# Patient Record
Sex: Female | Born: 1990 | Race: Black or African American | Hispanic: No | Marital: Single | Smoking: Never smoker
Health system: Southern US, Community
[De-identification: ages and names within clinical notes are randomized; demographics above are authoritative.]

## PROBLEM LIST (undated history)

## (undated) DIAGNOSIS — O24419 Gestational diabetes mellitus in pregnancy, unspecified control: Secondary | ICD-10-CM

## (undated) HISTORY — PX: NO PAST SURGERIES: SHX2092

## (undated) HISTORY — DX: Gestational diabetes mellitus in pregnancy, unspecified control: O24.419

---
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
## 2015-07-08 ENCOUNTER — Emergency Department (HOSPITAL_COMMUNITY)
Admission: EM | Admit: 2015-07-08 | Discharge: 2015-07-08 | Disposition: A | Discharge status: Home / Self Care | Payer: No Typology Code available for payment source | Attending: Emergency Medicine | Admitting: Emergency Medicine

## 2015-07-08 ENCOUNTER — Emergency Department (HOSPITAL_COMMUNITY): Payer: No Typology Code available for payment source

## 2015-07-08 ENCOUNTER — Encounter (HOSPITAL_COMMUNITY): Payer: Self-pay

## 2015-07-08 DIAGNOSIS — Z3202 Encounter for pregnancy test, result negative: Secondary | ICD-10-CM | POA: Diagnosis not present

## 2015-07-08 DIAGNOSIS — Y998 Other external cause status: Secondary | ICD-10-CM | POA: Diagnosis not present

## 2015-07-08 DIAGNOSIS — M62830 Muscle spasm of back: Secondary | ICD-10-CM | POA: Diagnosis not present

## 2015-07-08 DIAGNOSIS — Y92410 Unspecified street and highway as the place of occurrence of the external cause: Secondary | ICD-10-CM | POA: Diagnosis not present

## 2015-07-08 DIAGNOSIS — Y9389 Activity, other specified: Secondary | ICD-10-CM | POA: Insufficient documentation

## 2015-07-08 DIAGNOSIS — S3992XA Unspecified injury of lower back, initial encounter: Secondary | ICD-10-CM | POA: Diagnosis present

## 2015-07-08 LAB — I-STAT BETA HCG BLOOD, ED (MC, WL, AP ONLY): I-stat hCG, quantitative: <5 m[IU]/mL (ref <5)

## 2015-07-08 MED ORDER — NAPROXEN 500 MG PO TABS: 500.0000 mg | ORAL_TABLET | Freq: Two times a day (BID) | ORAL | Status: DC

## 2015-07-08 MED ORDER — NAPROXEN 500 MG PO TABS
500.0000 mg | ORAL_TABLET | Freq: Once | ORAL | Status: AC
  Administered 2015-07-08: 500 mg via ORAL
  Filled 2015-07-08: qty 1

## 2015-07-08 MED ORDER — METHOCARBAMOL 500 MG PO TABS: 500.0000 mg | ORAL_TABLET | Freq: Two times a day (BID) | ORAL | Status: DC

## 2015-07-08 MED ORDER — METHOCARBAMOL 500 MG PO TABS
500.0000 mg | ORAL_TABLET | Freq: Once | ORAL | Status: AC
  Administered 2015-07-08: 500 mg via ORAL
  Filled 2015-07-08: qty 1
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
## 2015-07-08 NOTE — Discharge Instructions (Signed)
Heat Therapy  Heat therapy can help ease sore, stiff, injured, and tight muscles and joints. Heat relaxes your muscles, which may help ease your pain.   RISKS AND COMPLICATIONS  If you have any of the following conditions, do not use heat therapy unless your health care provider has approved:   Poor circulation.   Healing wounds or scarred skin in the area being treated.   Diabetes, heart disease, or high blood pressure.   Not being able to feel (numbness) the area being treated.   Unusual swelling of the area being treated.   Active infections.   Blood clots.   Cancer.   Inability to communicate pain. This may include young children and people who have problems with their brain function (dementia).   Pregnancy.  Heat therapy should only be used on old, pre-existing, or long-lasting (chronic) injuries. Do not use heat therapy on new injuries unless directed by your health care provider.  HOW TO USE HEAT THERAPY  There are several different kinds of heat therapy, including:   Moist heat pack.   Warm water bath.   Hot water bottle.   Electric heating pad.   Heated gel pack.   Heated wrap.   Electric heating pad.  Use the heat therapy method suggested by your health care provider. Follow your health care provider's instructions on when and how to use heat therapy.  GENERAL HEAT THERAPY RECOMMENDATIONS   Do not sleep while using heat therapy. Only use heat therapy while you are awake.   Your skin may turn pink while using heat therapy. Do not use heat therapy if your skin turns red.   Do not use heat therapy if you have new pain.   High heat or long exposure to heat can cause burns. Be careful when using heat therapy to avoid burning your skin.   Do not use heat therapy on areas of your skin that are already irritated, such as with a rash or sunburn.  SEEK MEDICAL CARE IF:   You have blisters, redness, swelling, or numbness.   You have new pain.   Your pain is worse.  MAKE SURE  YOU:   Understand these instructions.   Will watch your condition.   Will get help right away if you are not doing well or get worse.     This information is not intended to replace advice given to you by your health care provider. Make sure you discuss any questions you have with your health care provider.     Document Released: 09/15/2011 Document Revised: 07/14/2014 Document Reviewed: 08/16/2013  Elsevier Interactive Patient Education 2016 [employer]

## 2015-10-30 ENCOUNTER — Ambulatory Visit (INDEPENDENT_AMBULATORY_CARE_PROVIDER_SITE_OTHER): Payer: Self-pay

## 2015-10-30 DIAGNOSIS — Z3202 Encounter for pregnancy test, result negative: Secondary | ICD-10-CM

## 2015-10-30 LAB — POCT URINALYSIS DIP (DEVICE)
BILIRUBIN URINE: NEGATIVE
GLUCOSE, UA: NEGATIVE mg/dL
Hgb urine dipstick: NEGATIVE
KETONES UR: NEGATIVE mg/dL
Leukocytes, UA: NEGATIVE
Nitrite: NEGATIVE
PROTEIN: NEGATIVE mg/dL
SPECIFIC GRAVITY, URINE: 1.02 (ref 1.005–1.030)
Urobilinogen, UA: 1 mg/dL (ref 0.0–1.0)
pH: 6.5 (ref 5.0–8.0)

## 2015-10-30 NOTE — Progress Notes (Signed)
Pt present to clinic today for possible pregnancy test. Test confirms she is not pregnant at this time. Pt states her period is late and she is not sure what is going on. I offered her appointment to to discuss with a provider. Pt stated she will call back at a later time to set up appt.

## 2015-10-31 LAB — POCT PREGNANCY, URINE: Preg Test, Ur: NEGATIVE

## 2016-07-27 ENCOUNTER — Encounter (HOSPITAL_COMMUNITY): Payer: Self-pay | Admitting: Emergency Medicine

## 2016-07-27 ENCOUNTER — Emergency Department (HOSPITAL_COMMUNITY)
Admission: EM | Admit: 2016-07-27 | Discharge: 2016-07-27 | Disposition: A | Discharge status: Home / Self Care | Payer: BLUE CROSS/BLUE SHIELD | Attending: Emergency Medicine | Admitting: Emergency Medicine

## 2016-07-27 DIAGNOSIS — L0231 Cutaneous abscess of buttock: Secondary | ICD-10-CM | POA: Insufficient documentation

## 2016-07-27 DIAGNOSIS — R509 Fever, unspecified: Secondary | ICD-10-CM | POA: Diagnosis not present

## 2016-07-27 DIAGNOSIS — L0291 Cutaneous abscess, unspecified: Secondary | ICD-10-CM

## 2016-07-27 MED ORDER — SULFAMETHOXAZOLE-TRIMETHOPRIM 800-160 MG PO TABS
1.0000 | ORAL_TABLET | Freq: Once | ORAL | Status: AC
  Administered 2016-07-27: 1 via ORAL
  Filled 2016-07-27: qty 1

## 2016-07-27 MED ORDER — LIDOCAINE-EPINEPHRINE (PF) 2 %-1:200000 IJ SOLN
10.0000 mL | Freq: Once | INTRAMUSCULAR | Status: AC
  Administered 2016-07-27: 10 mL
  Filled 2016-07-27: qty 20

## 2016-07-27 MED ORDER — IBUPROFEN 400 MG PO TABS
600.0000 mg | ORAL_TABLET | Freq: Once | ORAL | Status: AC
  Administered 2016-07-27: 600 mg via ORAL
  Filled 2016-07-27: qty 1

## 2016-07-27 MED ORDER — SULFAMETHOXAZOLE-TRIMETHOPRIM 800-160 MG PO TABS: 1.0000 | ORAL_TABLET | Freq: Two times a day (BID) | ORAL | 0 refills | Status: AC

## 2016-07-27 MED ORDER — HYDROCODONE-ACETAMINOPHEN 5-325 MG PO TABS: 1.0000 | ORAL_TABLET | ORAL | 0 refills | Status: DC | PRN

## 2016-07-27 NOTE — ED Triage Notes (Signed)
C/o abscess on buttocks x 3 days. 

## 2016-07-27 NOTE — ED Notes (Signed)
MD at bedside. 

## 2016-07-27 NOTE — ED Provider Notes (Signed)
[facility]-EMERGENCY DEPT Provider Note   CSN: [id number] Arrival date & time: 07/27/16  1824     History   Chief Complaint Chief Complaint  Patient presents with  . Abscess    HPI Sherri Schmidt is a 26 y.o. female.  Pt presents to the ED today with an abscess to her buttocks for the past 3 days.  She has had abscesses there in the past, but they usually go away on their own.  The pt has also developed a fever.  She was given tylenol in triage.      History reviewed. No pertinent past medical history.  There are no active problems to display for this patient.   History reviewed. No pertinent surgical history.  OB History    No data available       Home Medications    Prior to Admission medications   Medication Sig Start Date End Date Taking? Authorizing Provider  HYDROcodone-acetaminophen (NORCO/VICODIN) 5-325 MG tablet Take 1 tablet by mouth every 4 (four) hours as needed. 07/27/16   Jacalyn Lefevre, MD  methocarbamol (ROBAXIN) 500 MG tablet Take 1 tablet (500 mg total) by mouth 2 (two) times daily. 07/08/15   April Palumbo, MD  naproxen (NAPROSYN) 500 MG tablet Take 1 tablet (500 mg total) by mouth 2 (two) times daily. 07/08/15   April Palumbo, MD  sulfamethoxazole-trimethoprim (BACTRIM DS,SEPTRA DS) 800-160 MG tablet Take 1 tablet by mouth 2 (two) times daily. 07/27/16 08/03/16  Jacalyn Lefevre, MD    Family History No family history on file.  Social History Social History  Substance Use Topics  . Smoking status: Never Smoker  . Smokeless tobacco: Never Used  . Alcohol use No     Allergies   Other   Review of Systems Review of Systems  Skin:       abscess  All other systems reviewed and are negative.    Physical Exam Updated Vital Signs BP 99/85 (BP Location: Left Arm)   Pulse (!) 54   Temp 100.8 F (38.2 C) (Oral)   Ht 5\' 1"  (1.549 m)   Wt 148 lb 1 oz (67.2 kg)   LMP 07/25/2016   SpO2 94%   BMI 27.98 kg/m   Physical Exam  Constitutional:  She is oriented to person, place, and time. She appears well-developed and well-nourished.  HENT:  Head: Normocephalic and atraumatic.  Right Ear: External ear normal.  Left Ear: External ear normal.  Nose: Nose normal.  Mouth/Throat: Oropharynx is clear and moist.  Eyes: Conjunctivae and EOM are normal. Pupils are equal, round, and reactive to light.  Neck: Normal range of motion. Neck supple.  Cardiovascular: Normal rate, regular rhythm, normal heart sounds and intact distal pulses.   Pulmonary/Chest: Effort normal and breath sounds normal.  Abdominal: Soft. Bowel sounds are normal.  Musculoskeletal: Normal range of motion.  Neurological: She is alert and oriented to person, place, and time.  Skin:     Nursing note and vitals reviewed.    ED Treatments / Results  Labs (all labs ordered are listed, but only abnormal results are displayed) Labs Reviewed - No data to display  EKG  EKG Interpretation None       Radiology No results found.  Procedures .Marland KitchenIncision and Drainage Date/Time: 07/27/2016 10:13 PM Performed by: Jacalyn Lefevre Authorized by: Jacalyn Lefevre   Consent:    Consent obtained:  Verbal   Consent given by:  Patient   Risks discussed:  Bleeding, incomplete drainage and pain  Alternatives discussed:  No treatment Location:    Type:  Abscess   Size:  3 nu 3   Location:  Trunk   Trunk location:  Back Pre-procedure details:    Skin preparation:  Betadine Anesthesia (see MAR for exact dosages):    Anesthesia method:  Local infiltration   Local anesthetic:  Lidocaine 2% WITH epi Procedure type:    Complexity:  Simple Procedure details:    Incision types:  Cruciate   Incision depth:  Dermal   Scalpel blade:  11   Wound management:  Probed and deloculated   Drainage:  Purulent   Drainage amount:  Moderate   Wound treatment:  Wound left open   Packing materials:  None Post-procedure details:    Patient tolerance of procedure:  Tolerated well,  no immediate complications   (including critical care time)  Medications Ordered in ED Medications  lidocaine-EPINEPHrine (XYLOCAINE W/EPI) 2 %-1:200000 (PF) injection 10 mL (10 mLs Infiltration Given by Other 07/27/16 2157)  sulfamethoxazole-trimethoprim (BACTRIM DS,SEPTRA DS) 800-160 MG per tablet 1 tablet (1 tablet Oral Given 07/27/16 2158)  ibuprofen (ADVIL,MOTRIN) tablet 600 mg (600 mg Oral Given 07/27/16 2158)     Initial Impression / Assessment and Plan / ED Course  I have reviewed the triage vital signs and the nursing notes.  Pertinent labs & imaging results that were available during my care of the patient were reviewed by me and considered in my medical decision making (see chart for details).      Final Clinical Impressions(s) / ED Diagnoses   Final diagnoses:  Abscess  Fever, unspecified fever cause    New Prescriptions New Prescriptions   HYDROCODONE-ACETAMINOPHEN (NORCO/VICODIN) 5-325 MG TABLET    Take 1 tablet by mouth every 4 (four) hours as needed.   SULFAMETHOXAZOLE-TRIMETHOPRIM (BACTRIM DS,SEPTRA DS) 800-160 MG TABLET    Take 1 tablet by mouth 2 (two) times daily.     Jacalyn LefevreJulie Clovis Warwick, MD 07/27/16 2214

## 2019-01-05 ENCOUNTER — Inpatient Hospital Stay (HOSPITAL_COMMUNITY)
Admission: AD | Admit: 2019-01-05 | Discharge: 2019-01-05 | Disposition: A | Discharge status: Home / Self Care | Payer: BC Managed Care – PPO | Attending: Obstetrics and Gynecology | Admitting: Obstetrics and Gynecology

## 2019-01-05 ENCOUNTER — Inpatient Hospital Stay (HOSPITAL_COMMUNITY): Payer: BC Managed Care – PPO

## 2019-01-05 ENCOUNTER — Other Ambulatory Visit: Payer: Self-pay

## 2019-01-05 ENCOUNTER — Encounter (HOSPITAL_COMMUNITY): Payer: Self-pay | Admitting: *Deleted

## 2019-01-05 DIAGNOSIS — O209 Hemorrhage in early pregnancy, unspecified: Secondary | ICD-10-CM

## 2019-01-05 DIAGNOSIS — A5901 Trichomonal vulvovaginitis: Secondary | ICD-10-CM | POA: Diagnosis not present

## 2019-01-05 DIAGNOSIS — A599 Trichomoniasis, unspecified: Secondary | ICD-10-CM

## 2019-01-05 DIAGNOSIS — Z3A10 10 weeks gestation of pregnancy: Secondary | ICD-10-CM | POA: Diagnosis not present

## 2019-01-05 DIAGNOSIS — O98311 Other infections with a predominantly sexual mode of transmission complicating pregnancy, first trimester: Secondary | ICD-10-CM | POA: Diagnosis not present

## 2019-01-05 DIAGNOSIS — O23591 Infection of other part of genital tract in pregnancy, first trimester: Secondary | ICD-10-CM | POA: Diagnosis not present

## 2019-01-05 LAB — WET PREP, GENITAL
Clue Cells Wet Prep HPF POC: NONE SEEN
Sperm: NONE SEEN
Yeast Wet Prep HPF POC: NONE SEEN

## 2019-01-05 LAB — CBC WITH DIFFERENTIAL/PLATELET
Abs Immature Granulocytes: 0.01 10*3/uL (ref 0.00–0.07)
Basophils Absolute: 0 10*3/uL (ref 0.0–0.1)
Basophils Relative: 0 %
Eosinophils Absolute: 0 10*3/uL (ref 0.0–0.5)
Eosinophils Relative: 1 %
HCT: 35.2 % — ABNORMAL LOW (ref 36.0–46.0)
Hemoglobin: 11.5 g/dL — ABNORMAL LOW (ref 12.0–15.0)
Immature Granulocytes: 0 %
Lymphocytes Relative: 28 %
Lymphs Abs: 2.2 10*3/uL (ref 0.7–4.0)
MCH: 28.5 pg (ref 26.0–34.0)
MCHC: 32.7 g/dL (ref 30.0–36.0)
MCV: 87.3 fL (ref 80.0–100.0)
Monocytes Absolute: 0.6 10*3/uL (ref 0.1–1.0)
Monocytes Relative: 7 %
Neutro Abs: 4.8 10*3/uL (ref 1.7–7.7)
Neutrophils Relative %: 64 %
Platelets: 209 10*3/uL (ref 150–400)
RBC: 4.03 MIL/uL (ref 3.87–5.11)
RDW: 12.2 % (ref 11.5–15.5)
WBC: 7.6 10*3/uL (ref 4.0–10.5)
nRBC: 0 % (ref 0.0–0.2)

## 2019-01-05 LAB — URINALYSIS, ROUTINE W REFLEX MICROSCOPIC
Bilirubin Urine: NEGATIVE
Glucose, UA: NEGATIVE mg/dL
Hgb urine dipstick: NEGATIVE
Ketones, ur: NEGATIVE mg/dL
Nitrite: NEGATIVE
Protein, ur: NEGATIVE mg/dL
Specific Gravity, Urine: 1.008 (ref 1.005–1.030)
pH: 7 (ref 5.0–8.0)

## 2019-01-05 LAB — HCG, QUANTITATIVE, PREGNANCY: hCG, Beta Chain, Quant, S: 125025 m[IU]/mL — ABNORMAL HIGH (ref <5)

## 2019-01-05 LAB — ABO/RH: ABO/RH(D): A POS

## 2019-01-05 LAB — POCT PREGNANCY, URINE: Preg Test, Ur: POSITIVE — AB

## 2019-01-05 MED ORDER — METRONIDAZOLE 500 MG PO TABS
2000.0000 mg | ORAL_TABLET | Freq: Once | ORAL | Status: AC
  Administered 2019-01-05: 2000 mg via ORAL

## 2019-01-05 MED ORDER — METRONIDAZOLE 500 MG PO TABS
1000.0000 mg | ORAL_TABLET | Freq: Once | ORAL | Status: DC
  Filled 2019-01-05: qty 2

## 2019-01-05 NOTE — Discharge Instructions (Signed)
Trichomoniasis Trichomoniasis is an STI (sexually transmitted infection) that can affect both women and men. In women, the outer area of the female genitalia (vulva) and the vagina are affected. In men, mainly the penis is affected, but the prostate and other reproductive organs can also be involved.  This condition can be treated with medicine. It often has no symptoms (is asymptomatic), especially in men. If not treated, trichomoniasis can last for months or years. What are the causes? This condition is caused by a parasite called Trichomonas vaginalis. Trichomoniasis most often spreads from person to person (is contagious) through sexual contact. What increases the risk? The following factors may make you more likely to develop this condition:  Having unprotected sex.  Having sex with a partner who has trichomoniasis.  Having multiple sexual partners.  Having had previous trichomoniasis infections or other STIs. What are the signs or symptoms? In women, symptoms of trichomoniasis include:  Abnormal vaginal discharge that is clear, white, gray, or yellow-green and foamy and has an unusual "fishy" odor.  Itching and irritation of the vagina and vulva.  Burning or pain during urination or sex.  Redness and swelling of the genitals. In men, symptoms of trichomoniasis include:  Penile discharge that may be foamy or contain pus.  Pain in the penis. This may happen only when urinating.  Itching or irritation inside the penis.  Burning after urination or ejaculation. How is this diagnosed? In women, this condition may be found during a routine Pap test or physical exam. It may be found in men during a routine physical exam. Your health care provider may do tests to help diagnose this infection, such as:  Urine tests (men and women).  The following in women: ? Testing the pH of the vagina. ? A vaginal swab test that checks for the Trichomonas vaginalis parasite. ? Testing vaginal  secretions. Your health care provider may test you for other STIs, including HIV (human immunodeficiency virus). How is this treated? This condition is treated with medicine taken by mouth (orally), such as metronidazole or tinidazole, to fight the infection. Your sexual partner(s) also need to be tested and treated.  If you are a woman and you plan to become pregnant or think you may be pregnant, tell your health care provider right away. Some medicines that are used to treat the infection should not be taken during pregnancy. Your health care provider may recommend over-the-counter medicines or creams to help relieve itching or irritation. You may be tested for infection again 3 months after treatment. Follow these instructions at home:  Take and use over-the-counter and prescription medicines, including creams, only as told by your health care provider.  Take your antibiotic medicine as told by your health care provider. Do not stop taking the antibiotic even if you start to feel better.  Do not have sex until 7-10 days after you finish your medicine, or until your health care provider approves. Ask your health care provider when you may start to have sex again.  (Women) Do not douche or wear tampons while you have the infection.  Discuss your infection with your sexual partner(s). Make sure that your partner gets tested and treated, if necessary.  Keep all follow-up visits as told by your health care provider. This is important. How is this prevented?   Use condoms every time you have sex. Using condoms correctly and consistently can help protect against STIs.  Avoid having multiple sexual partners.  Talk with your sexual partner about any   symptoms that either of you may have, as well as any history of STIs.  Get tested for STIs and STDs (sexually transmitted diseases) before you have sex. Ask your partner to do the same.  Do not have sexual contact if you have symptoms of  trichomoniasis or another STI. Contact a health care provider if:  You still have symptoms after you finish your medicine.  You develop pain in your abdomen.  You have pain when you urinate.  You have bleeding after sex.  You develop a rash.  You feel nauseous or you vomit.  You plan to become pregnant or think you may be pregnant. Summary  Trichomoniasis is an STI (sexually transmitted infection) that can affect both women and men.  This condition often has no symptoms (is asymptomatic), especially in men.  Without treatment, this condition can last for months or years.  You should not have sex until 7-10 days after you finish your medicine, or until your health care provider approves. Ask your health care provider when you may start to have sex again.  Discuss your infection with your sexual partner(s). Make sure that your partner gets tested and treated, if necessary. This information is not intended to replace advice given to you by your health care provider. Make sure you discuss any questions you have with your health care provider. Document Released: 12/17/2000 Document Revised: 04/06/2018 Document Reviewed: 04/06/2018 Elsevier Patient Education  2020 Else[employer] Inc.  
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
## 2019-01-05 NOTE — MAU Provider Note (Signed)
History     CSN: [id number]  Arrival date and time: 01/05/19 1548   First Provider Initiated Contact with Patient 01/05/19 1631      Chief Complaint  Patient presents with  . Vaginal Bleeding   HPI  Ms. Denyce RobertLatoya M Mcnealy is a 28 y.o. G2P1001 at 2827w4d who presents to MAU today with complaint of vaginal bleeding noted once earlier today. She denies any bleeding since. She denies pain or abnormal discharge or UTI symptoms. She states last intercourse was about a month ago.   OB History    Gravida  2   Para  1   Term  1   Preterm      AB      Living  1     SAB      TAB      Ectopic      Multiple      Live Births  1           History reviewed. No pertinent past medical history.  History reviewed. No pertinent surgical history.  History reviewed. No pertinent family history.  Social History   Tobacco Use  . Smoking status: Never Smoker  . Smokeless tobacco: Never Used  Substance Use Topics  . Alcohol use: No  . Drug use: No    Allergies:  Allergies  Allergen Reactions  . Other Rash    Chlorox products--rash    Medications Prior to Admission  Medication Sig Dispense Refill Last Dose  . Prenatal Vit-Fe Fumarate-FA (PRENATAL MULTIVITAMIN) TABS tablet Take 1 tablet by mouth daily at 12 noon.   01/05/2019 at Unknown time  . HYDROcodone-acetaminophen (NORCO/VICODIN) 5-325 MG tablet Take 1 tablet by mouth every 4 (four) hours as needed. 10 tablet 0   . methocarbamol (ROBAXIN) 500 MG tablet Take 1 tablet (500 mg total) by mouth 2 (two) times daily. 20 tablet 0   . naproxen (NAPROSYN) 500 MG tablet Take 1 tablet (500 mg total) by mouth 2 (two) times daily. 30 tablet 0     Review of Systems  Constitutional: Negative for fever.  Gastrointestinal: Negative for abdominal pain, constipation, diarrhea, nausea and vomiting.  Genitourinary: Positive for vaginal bleeding. Negative for dysuria, frequency, urgency and vaginal discharge.   Physical Exam   Blood  pressure 134/72, pulse 81, temperature 98.1 F (36.7 C), temperature source Oral, resp. rate 17, weight 82.6 kg, last menstrual period 10/23/2018, SpO2 100 %.  Physical Exam  Nursing note and vitals reviewed. Constitutional: She is oriented to person, place, and time. She appears well-developed and well-nourished. No distress.  HENT:  Head: Normocephalic and atraumatic.  Cardiovascular: Normal rate.  Respiratory: Effort normal.  GI: Soft. She exhibits no distension and no mass. There is no abdominal tenderness. There is no rebound and no guarding.  Genitourinary: Uterus is not enlarged (exam limited by maternal body habitus) and not tender. Cervix exhibits no motion tenderness, no discharge and no friability. Right adnexum displays no mass and no tenderness. Left adnexum displays no mass and no tenderness.    Vaginal discharge (small frothy white) present.     No vaginal bleeding.  No bleeding in the vagina.  Neurological: She is alert and oriented to person, place, and time.  Skin: Skin is warm and dry. No erythema.  Psychiatric: She has a normal mood and affect.  Cervix: closed, thick  Results for orders placed or performed during the hospital encounter of 01/05/19 (from the past 24 hour(s))  Urinalysis, Routine w reflex microscopic  Status: Abnormal   Collection Time: 01/05/19  4:15 PM  Result Value Ref Range   Color, Urine YELLOW YELLOW   APPearance HAZY (A) CLEAR   Specific Gravity, Urine 1.008 1.005 - 1.030   pH 7.0 5.0 - 8.0   Glucose, UA NEGATIVE NEGATIVE mg/dL   Hgb urine dipstick NEGATIVE NEGATIVE   Bilirubin Urine NEGATIVE NEGATIVE   Ketones, ur NEGATIVE NEGATIVE mg/dL   Protein, ur NEGATIVE NEGATIVE mg/dL   Nitrite NEGATIVE NEGATIVE   Leukocytes,Ua LARGE (A) NEGATIVE   RBC / HPF 0-5 0 - 5 RBC/hpf   WBC, UA 6-10 0 - 5 WBC/hpf   Bacteria, UA RARE (A) NONE SEEN   Squamous Epithelial / LPF 0-5 0 - 5  Pregnancy, urine POC     Status: Abnormal   Collection Time:  01/05/19  4:25 PM  Result Value Ref Range   Preg Test, Ur POSITIVE (A) NEGATIVE  CBC with Differential/Platelet     Status: Abnormal   Collection Time: 01/05/19  4:50 PM  Result Value Ref Range   WBC 7.6 4.0 - 10.5 K/uL   RBC 4.03 3.87 - 5.11 MIL/uL   Hemoglobin 11.5 (L) 12.0 - 15.0 g/dL   HCT 35.2 (L) 36.0 - 46.0 %   MCV 87.3 80.0 - 100.0 fL   MCH 28.5 26.0 - 34.0 pg   MCHC 32.7 30.0 - 36.0 g/dL   RDW 12.2 11.5 - 15.5 %   Platelets 209 150 - 400 K/uL   nRBC 0.0 0.0 - 0.2 %   Neutrophils Relative % 64 %   Neutro Abs 4.8 1.7 - 7.7 K/uL   Lymphocytes Relative 28 %   Lymphs Abs 2.2 0.7 - 4.0 K/uL   Monocytes Relative 7 %   Monocytes Absolute 0.6 0.1 - 1.0 K/uL   Eosinophils Relative 1 %   Eosinophils Absolute 0.0 0.0 - 0.5 K/uL   Basophils Relative 0 %   Basophils Absolute 0.0 0.0 - 0.1 K/uL   Immature Granulocytes 0 %   Abs Immature Granulocytes 0.01 0.00 - 0.07 K/uL  ABO/Rh     Status: None   Collection Time: 01/05/19  4:50 PM  Result Value Ref Range   ABO/RH(D) A POS    No rh immune globuloin      NOT A RH IMMUNE GLOBULIN CANDIDATE, PT RH POSITIVE Performed at [facility], [place] [address] [place], [place] [address]   Wet prep, genital     Status: Abnormal   Collection Time: 01/05/19  4:55 PM  Result Value Ref Range   Yeast Wet Prep HPF POC NONE SEEN NONE SEEN   Trich, Wet Prep PRESENT (A) NONE SEEN   Clue Cells Wet Prep HPF POC NONE SEEN NONE SEEN   WBC, Wet Prep HPF POC MANY (A) NONE SEEN   Sperm NONE SEEN      MAU Course  Procedures None  MDM +UPT UA, wet prep, GC/chlamydia, CBC, ABO/Rh, quant hCG, HIV, RPR and [place] today to rule out ectopic pregnancy 2 G Flagyl given in MAU today for trichomonas  Assessment and Plan  A: [facility] at [redacted]w[redacted]d Trichomonas  P: Discharge home Treated in MAU with Flagyl Partner treatment advised Patient advised to abstain from intercourse x 2 weeks after partner treatment  First trimester precautions discussed Patient  advised to follow-up with [facility] as planned to start prenatal care on Monday Patient may return to MAU as needed or if her condition were to change or worsen  Kerry Hough, PA-C  01/05/2019, 5:39 PM

## 2019-01-06 LAB — RPR: RPR Ser Ql: NONREACTIVE

## 2019-01-06 LAB — GC/CHLAMYDIA PROBE AMP (~~LOC~~) NOT AT ARMC
Chlamydia: NEGATIVE
Neisseria Gonorrhea: NEGATIVE

## 2019-01-07 LAB — HIV ANTIBODY (ROUTINE TESTING W REFLEX): HIV Screen 4th Generation wRfx: NONREACTIVE

## 2019-01-10 ENCOUNTER — Ambulatory Visit (INDEPENDENT_AMBULATORY_CARE_PROVIDER_SITE_OTHER): Payer: BC Managed Care – PPO

## 2019-01-10 ENCOUNTER — Other Ambulatory Visit: Payer: Self-pay

## 2019-01-10 ENCOUNTER — Other Ambulatory Visit (HOSPITAL_COMMUNITY)
Admission: RE | Admit: 2019-01-10 | Discharge: 2019-01-10 | Disposition: A | Discharge status: Home / Self Care | Payer: BC Managed Care – PPO | Source: Ambulatory Visit

## 2019-01-10 VITALS — BP 122/79 | HR 79 | Wt 179.7 lb

## 2019-01-10 DIAGNOSIS — O0990 Supervision of high risk pregnancy, unspecified, unspecified trimester: Secondary | ICD-10-CM | POA: Insufficient documentation

## 2019-01-10 DIAGNOSIS — Z3481 Encounter for supervision of other normal pregnancy, first trimester: Secondary | ICD-10-CM

## 2019-01-10 DIAGNOSIS — A599 Trichomoniasis, unspecified: Secondary | ICD-10-CM

## 2019-01-10 DIAGNOSIS — Z3480 Encounter for supervision of other normal pregnancy, unspecified trimester: Secondary | ICD-10-CM

## 2019-01-10 DIAGNOSIS — O98911 Unspecified maternal infectious and parasitic disease complicating pregnancy, first trimester: Secondary | ICD-10-CM

## 2019-01-10 DIAGNOSIS — Z3A11 11 weeks gestation of pregnancy: Secondary | ICD-10-CM

## 2019-01-10 HISTORY — DX: Supervision of high risk pregnancy, unspecified, unspecified trimester: O09.90

## 2019-01-10 MED ORDER — BLOOD PRESSURE MONITORING KIT: 1.0000 | PACK | 0 refills | Status: DC

## 2019-01-10 NOTE — Progress Notes (Signed)
Subjective:   Sherri Schmidt is a 28 y.o. G3P1011 at 42w2dby LMP being seen today for her first obstetrical visit.  She denies significant current or past obstetrical and medical history. Patient is unsure of her feeding method for infant. Pregnancy history fully reviewed.  Patient was recently treated for Trichomoniasis on July 1st and denies current symptoms including discharge, bleeding, itching, or odor.  She states she is not speaking to FOB and is unsure of whether he is aware of diagnosis.   Patient reports no complaints.  HISTORY: OB History  Gravida Para Term Preterm AB Living  _0 0 1 1  SAB TAB Ectopic Multiple Live Births  0 1 0 0 1    # Outcome Date GA Lbr Len/2nd Weight Sex Delivery Anes PTL Lv  3 Current           2 TAB 2018 741w0d       1 Term 05/19/09 4047w0dM Vag-Spont   LIV    Last pap smear was done today   History reviewed. No pertinent past medical history. History reviewed. No pertinent surgical history. Family History  Problem Relation Age of Onset  . Hypertension Mother   . Diabetes Mother    Social History   Tobacco Use  . Smoking status: Never Smoker  . Smokeless tobacco: Never Used  Substance Use Topics  . Alcohol use: No  . Drug use: No   Allergies  Allergen Reactions  . Other Rash    Chlorox products--rash   Current Outpatient Medications on File Prior to Visit  Medication Sig Dispense Refill  . Prenatal Vit-Fe Fumarate-FA (PRENATAL MULTIVITAMIN) TABS tablet Take 1 tablet by mouth daily at 12 noon.     No current facility-administered medications on file prior to visit.     Review of Systems Pertinent items noted in HPI and remainder of comprehensive ROS otherwise negative.  Exam   Vitals:   01/10/19 1005  BP: 122/79  Pulse: 79  Weight: 179 lb 11.2 oz (81.5 kg)   Fetal Heart Rate (bpm): 165 Physical Exam  Constitutional: She is oriented to person, place, and time and well-developed, well-nourished, and in no  distress.  HENT:  Head: Normocephalic and atraumatic.  Eyes: Conjunctivae are normal.  Neck: Normal range of motion.  Cardiovascular: Normal rate, regular rhythm and normal heart sounds.  Pulmonary/Chest: Effort normal and breath sounds normal. No respiratory distress.  Abdominal: Soft. Bowel sounds are normal. There is no abdominal tenderness.  Genitourinary: Uterus is enlarged. Cervix exhibits no motion tenderness.    Vaginal discharge present.     Creamy found.     Genitourinary Comments: Speculum Exam: -Vaginal Vault: Dark Red Mucosa c/w mild inflammation, but no tenderness.  Small amt thin white discharge -Cervix: Red, no lesions, cysts, or polyps.  Appears closed. No active bleeding from os-Pap collected -Bimanual Exam: Closed/Long Uterus size c/w dates No tenderness   Musculoskeletal: Normal range of motion.        General: No edema.  Neurological: She is alert and oriented to person, place, and time.  Skin: Skin is warm and dry.  Psychiatric: Affect and judgment normal.    Assessment:   Pregnancy: G3P1011 Patient Active Problem List   Diagnosis Date Noted  . Supervision of other normal pregnancy, antepartum 01/10/2019     Plan:  1. Supervision of other normal pregnancy, antepartum  -Congratulations given and patient welcomed to practice. -Discussed Babyscripts and virtual visits  as primary source of PN visits in midst of coronavirus.   -Encouraged to seek out care at office or emergency room for urgent and/or emergent concerns. -Educated on the nature of [facility] with multiple MDs and other Advanced Practice Providers was explained to patient; also emphasized that residents, students are part of our team. Informed of her right to refuse care as she deems appropriate.  -No questions or concerns.  -Anticipatory guidance for prenatal visits including labs, ultrasounds, and testing. -Encouraged to complete MyChart Registration for  her ability to review results, send requests, and have questions addressed.  -Discussed due date based on Definite LMP of October 23, 2018. -Continue prenatal vitamins. . -Is undecided on feeding options and utilize Depo Provera for PP BC method. -Desires circumcision if a female child.  Labs: - Cytology - PAP( [place]) - Culture, OB Urine - Genetic Screening - Rubella, Heb B - Babyscripts Schedule Optimization - Blood Pressure Monitoring KIT; 1 kit by Does not apply route once a week.  Dispense: 1 kit; Refill: 0  Ultrasound discussed; fetal anatomic survey: ordered for approximately Mar 07, 2019 Problem list reviewed and updated. Routine obstetric precautions reviewed.  -Discussed need for Trichomoniasis TOC at next in person visit or when evaluated in MAU.    Return in about 5 weeks (around 02/14/2019) for LR-ROB via Virtual Visit.    Maryann Conners, CNM 01/10/2019 10:25 AM

## 2019-01-10 NOTE — Progress Notes (Signed)
Patient is in the office for NOB, denies pain. Pt states that she has a hard time hearing sometimes. Pt was suppose to wear hearing aids when she was 20, but never did, states that no one else in her family has an issue. Pt desires genetic testing, states that she was approved for Medicaid but does not have card yet, advised that she may be charged thru private insurance until she can provide that info.

## 2019-01-11 LAB — CYTOLOGY - PAP: Diagnosis: NEGATIVE

## 2019-01-11 LAB — RUBELLA ANTIBODY, IGM: Rubella IgM: <20 AU/mL (ref 0.0–19.9)

## 2019-01-11 LAB — HEPATITIS B SURFACE ANTIGEN: Hepatitis B Surface Ag: NEGATIVE

## 2019-01-12 LAB — CULTURE, OB URINE

## 2019-01-12 LAB — URINE CULTURE, OB REFLEX

## 2019-01-18 ENCOUNTER — Telehealth: Payer: Self-pay | Admitting: *Deleted

## 2019-01-18 NOTE — Telephone Encounter (Signed)
Pt called to office to verify genetic screening results.  Results were reviewed with pt.  Pt has no other concerns.

## 2019-02-06 ENCOUNTER — Other Ambulatory Visit: Payer: Self-pay

## 2019-02-06 ENCOUNTER — Emergency Department (HOSPITAL_COMMUNITY)
Admission: EM | Admit: 2019-02-06 | Discharge: 2019-02-06 | Disposition: A | Discharge status: Home / Self Care | Payer: BC Managed Care – PPO | Attending: Emergency Medicine | Admitting: Emergency Medicine

## 2019-02-06 ENCOUNTER — Encounter (HOSPITAL_COMMUNITY): Payer: Self-pay | Admitting: Emergency Medicine

## 2019-02-06 DIAGNOSIS — L0501 Pilonidal cyst with abscess: Secondary | ICD-10-CM | POA: Insufficient documentation

## 2019-02-06 MED ORDER — CLINDAMYCIN HCL 150 MG PO CAPS
300.0000 mg | ORAL_CAPSULE | Freq: Once | ORAL | Status: AC
  Administered 2019-02-06: 300 mg via ORAL
  Filled 2019-02-06: qty 2

## 2019-02-06 MED ORDER — ACETAMINOPHEN 325 MG PO TABS
650.0000 mg | ORAL_TABLET | Freq: Once | ORAL | Status: AC
  Administered 2019-02-06: 650 mg via ORAL
  Filled 2019-02-06: qty 2

## 2019-02-06 MED ORDER — CLINDAMYCIN HCL 300 MG PO CAPS: 300.0000 mg | ORAL_CAPSULE | Freq: Three times a day (TID) | ORAL | 0 refills | Status: AC

## 2019-02-06 NOTE — Discharge Instructions (Signed)
Take antibiotics as prescribed.  You may or may not have improvement with antibiotics.  This may need to be drained. Use Tylenol for pain. Use warm compresses to help with pain. (washcloth soaked with warm water) You may return to the ER follow-up with general surgery to get this drained. Return to the emergency room if you develop high fevers, persistent vomiting, or severe worsening pain.  Return with any new, worsening, concerning symptoms.

## 2019-02-06 NOTE — ED Notes (Signed)
Spoke with provider in [place] [facility]. Patient able to be seen in [facility] and no orders at this time.

## 2019-02-06 NOTE — ED Provider Notes (Signed)
[facility] EMERGENCY DEPARTMENT Provider Note   CSN: [id number] Arrival date & time: 02/06/19  1533    History   Chief Complaint Chief Complaint  Patient presents with  . Abscess    HPI Sherri Schmidt is a 28 y.o. female presenting for evaluation of abscess.  Patient states last week she developed an area just above her gluteal cleft that started become painful and swollen.  It has continued to grow and become more painful since.  She reports similar lesion last year, has had this multiple times.  She needed it drained last year.  She denies fevers, chills, nausea, vomiting.  She denies difficulty with bowel movements.  She is [redacted] weeks pregnant, has not considered high risk.  She has not taken anything for her symptoms including Tylenol.  She has no other medical problems, takes no medications daily.     HPI  History reviewed. No pertinent past medical history.  Patient Active Problem List   Diagnosis Date Noted  . Supervision of other normal pregnancy, antepartum 01/10/2019    History reviewed. No pertinent surgical history.   OB History    Gravida  3   Para  1   Term  1   Preterm      AB  1   Living  1     SAB      TAB  1   Ectopic      Multiple      Live Births  1            Home Medications    Prior to Admission medications   Medication Sig Start Date End Date Taking? Authorizing Provider  Blood Pressure Monitoring KIT 1 kit by Does not apply route once a week. 01/10/19   Gavin Pound, CNM  clindamycin (CLEOCIN) 300 MG capsule Take 1 capsule (300 mg total) by mouth 3 (three) times daily for 5 days. 02/06/19 02/11/19  Addelynn Batte, PA-C  Prenatal Vit-Fe Fumarate-FA (PRENATAL MULTIVITAMIN) TABS tablet Take 1 tablet by mouth daily at 12 noon.    [provider]    Family History Family History  Problem Relation Age of Onset  . Hypertension Mother   . Diabetes Mother     Social History Social History    Tobacco Use  . Smoking status: Never Smoker  . Smokeless tobacco: Never Used  Substance Use Topics  . Alcohol use: No  . Drug use: No     Allergies   Other   Review of Systems Review of Systems  Skin:       Pilonidal abscess  All other systems reviewed and are negative.    Physical Exam Updated Vital Signs BP (!) 112/54 (BP Location: Right Arm)   Pulse 78   Temp 98.5 F (36.9 C) (Oral)   Resp 16   Ht '5\' 1"'$  (1.549 m)   Wt 80 kg   LMP 10/23/2018   SpO2 100%   BMI 33.32 kg/m   Physical Exam Vitals signs and nursing note reviewed.  Constitutional:      General: She is not in acute distress.    Appearance: She is well-developed.     Comments: Appears nontoxic  HENT:     Head: Normocephalic and atraumatic.  Eyes:     Extraocular Movements: Extraocular movements intact.     Conjunctiva/sclera: Conjunctivae normal.     Pupils: Pupils are equal, round, and reactive to light.  Neck:     Musculoskeletal: Normal range of  motion and neck supple.  Cardiovascular:     Rate and Rhythm: Normal rate and regular rhythm.     Pulses: Normal pulses.     Comments: Heart rate normal on my exam. Pulmonary:     Effort: Pulmonary effort is normal. No respiratory distress.     Breath sounds: Normal breath sounds. No wheezing.  Abdominal:     General: There is no distension.     Palpations: Abdomen is soft. There is no mass.     Tenderness: There is no abdominal tenderness. There is no guarding or rebound.  Genitourinary:    Comments: Pilonidal abscess without obvious cellulitis.  Area of fluctuance just above the gluteal cleft.  No tenderness near the rectum. Musculoskeletal: Normal range of motion.  Skin:    General: Skin is warm and dry.     Capillary Refill: Capillary refill takes less than 2 seconds.  Neurological:     Mental Status: She is alert and oriented to person, place, and time.      ED Treatments / Results  Labs (all labs ordered are listed, but only  abnormal results are displayed) Labs Reviewed - No data to display  EKG None  Radiology No results found.  Procedures Procedures (including critical care time)  Medications Ordered in ED Medications  acetaminophen (TYLENOL) tablet 650 mg (650 mg Oral Given 02/06/19 1801)  clindamycin (CLEOCIN) capsule 300 mg (300 mg Oral Given 02/06/19 1801)     Initial Impression / Assessment and Plan / ED Course  I have reviewed the triage vital signs and the nursing notes.  Pertinent labs & imaging results that were available during my care of the patient were reviewed by me and considered in my medical decision making (see chart for details).        Patient presenting for evaluation of pilonidal abscess.  Physical exam reassuring, she appears nontoxic.  No signs of systemic infection.  No obvious cellulitis.  Discussed with patient ideal treatment of I&D.  Patient declines at this time, as last time he was drained it was very painful.  Discussed option of trial of antibiotics, however high risk of failure.  Patient states she understands and would like to try antibiotics.  Initially she was tachycardic, but this resolved without intervention.  Patient given Tylenol and first dose of antibiotics in the ER.  Encourage follow-up with general surgery or back in the ER if lesion needs drainage.  Encourage follow-up with general surgery for further management and prevention of future pilonidal abscesses.  At this time, patient appears safe for discharge.  Return precautions given.  Patient states she understands and agrees to plan.  Final Clinical Impressions(s) / ED Diagnoses   Final diagnoses:  Pilonidal abscess    ED Discharge Orders         Ordered    clindamycin (CLEOCIN) 300 MG capsule  3 times daily     02/06/19 [id number] [place], PA-C 02/06/19 Nino Parsley, MD 02/21/19 [phone]

## 2019-02-06 NOTE — ED Triage Notes (Signed)
Onset 1 weeks ago developed an abscess same location as a year ago location upper buttock. States pin increasing overtime and is currently [redacted] weeks pregnant with no complaints.

## 2019-02-06 NOTE — ED Notes (Signed)
Confirmed okay with [facility].Marland KitchenMarland Kitchen

## 2019-02-14 ENCOUNTER — Encounter: Payer: Self-pay | Admitting: Obstetrics and Gynecology

## 2019-02-14 ENCOUNTER — Ambulatory Visit (INDEPENDENT_AMBULATORY_CARE_PROVIDER_SITE_OTHER): Payer: BC Managed Care – PPO | Admitting: Obstetrics and Gynecology

## 2019-02-14 VITALS — BP 133/84 | HR 93

## 2019-02-14 DIAGNOSIS — D563 Thalassemia minor: Secondary | ICD-10-CM | POA: Insufficient documentation

## 2019-02-14 DIAGNOSIS — Z3480 Encounter for supervision of other normal pregnancy, unspecified trimester: Secondary | ICD-10-CM

## 2019-02-14 DIAGNOSIS — Z3A16 16 weeks gestation of pregnancy: Secondary | ICD-10-CM

## 2019-02-14 DIAGNOSIS — Z3482 Encounter for supervision of other normal pregnancy, second trimester: Secondary | ICD-10-CM

## 2019-02-14 NOTE — Progress Notes (Signed)
Pt is on the phone preparing for virtual visit with provider. [redacted]w[redacted]d.

## 2019-02-14 NOTE — Progress Notes (Signed)
   TELEHEALTH OBSTETRICS PRENATAL VIRTUAL VIDEO VISIT ENCOUNTER NOTE  Provider location: [facility] [place]   I connected with Sherri Schmidt on 02/14/19 at  9:30 AM EDT by WebEx Video Encounter at home and verified that I am speaking with the correct person using two identifiers.   I discussed the limitations, risks, security and privacy concerns of performing an evaluation and management service virtually and the availability of in person appointments. I also discussed with the patient that there may be a patient responsible charge related to this service. The patient expressed understanding and agreed to proceed. Subjective:  Sherri Schmidt is a 28 y.o. G3P1011 at [redacted]w[redacted]d being seen today for ongoing prenatal care.  She is currently monitored for the following issues for this low-risk pregnancy and has Supervision of other normal pregnancy, antepartum and Alpha thalassemia silent carrier on their problem list.  Patient reports occasional cramping.  Contractions: Not present. Vag. Bleeding: None.   . Denies any leaking of fluid.   The following portions of the patient's history were reviewed and updated as appropriate: allergies, current medications, past family history, past medical history, past social history, past surgical history and problem list.   Objective:   Vitals:   02/14/19 0938  BP: 133/84  Pulse: 93    Fetal Status:           General:  Alert, oriented and cooperative. Patient is in no acute distress.  Respiratory: Normal respiratory effort, no problems with respiration noted  Mental Status: Normal mood and affect. Normal behavior. Normal judgment and thought content.  Rest of physical exam deferred due to type of encounter  Imaging: No results found.  Assessment and Plan:  Pregnancy: G3P1011 at [redacted]w[redacted]d  1. Supervision of other normal pregnancy, antepartum Anatomy scheduled for 03/07/2019  Preterm labor symptoms and general obstetric  precautions including but not limited to vaginal bleeding, contractions, leaking of fluid and fetal movement were reviewed in detail with the patient. I discussed the assessment and treatment plan with the patient. The patient was provided an opportunity to ask questions and all were answered. The patient agreed with the plan and demonstrated an understanding of the instructions. The patient was advised to call back or seek an in-person office evaluation/go to [facility]U at [facility] for any urgent or concerning symptoms. Please refer to After Visit Summary for other counseling recommendations.   I provided 15 minutes of face-to-face time during this encounter.  Return in about 4 weeks (around 03/14/2019) for OB visit, virtual.  Future Appointments  Date Time Provider [place]  03/07/2019 10:30 AM [facility] [place], [place] [facility], [place]

## 2019-03-07 ENCOUNTER — Other Ambulatory Visit: Payer: Self-pay

## 2019-03-07 ENCOUNTER — Ambulatory Visit (HOSPITAL_COMMUNITY)
Admission: RE | Admit: 2019-03-07 | Discharge: 2019-03-07 | Disposition: A | Discharge status: Home / Self Care | Payer: BC Managed Care – PPO | Source: Ambulatory Visit

## 2019-03-07 ENCOUNTER — Other Ambulatory Visit (HOSPITAL_COMMUNITY): Payer: Self-pay | Admitting: *Deleted

## 2019-03-07 DIAGNOSIS — Z3480 Encounter for supervision of other normal pregnancy, unspecified trimester: Secondary | ICD-10-CM | POA: Diagnosis present

## 2019-03-07 DIAGNOSIS — Z363 Encounter for antenatal screening for malformations: Secondary | ICD-10-CM | POA: Diagnosis not present

## 2019-03-07 DIAGNOSIS — Z362 Encounter for other antenatal screening follow-up: Secondary | ICD-10-CM

## 2019-03-07 DIAGNOSIS — Z3A18 18 weeks gestation of pregnancy: Secondary | ICD-10-CM

## 2019-03-15 ENCOUNTER — Ambulatory Visit (INDEPENDENT_AMBULATORY_CARE_PROVIDER_SITE_OTHER): Payer: BC Managed Care – PPO | Admitting: Obstetrics

## 2019-03-15 ENCOUNTER — Encounter: Payer: Self-pay | Admitting: Obstetrics

## 2019-03-15 DIAGNOSIS — Z3482 Encounter for supervision of other normal pregnancy, second trimester: Secondary | ICD-10-CM

## 2019-03-15 DIAGNOSIS — Z3480 Encounter for supervision of other normal pregnancy, unspecified trimester: Secondary | ICD-10-CM

## 2019-03-15 DIAGNOSIS — D563 Thalassemia minor: Secondary | ICD-10-CM

## 2019-03-15 DIAGNOSIS — A599 Trichomoniasis, unspecified: Secondary | ICD-10-CM

## 2019-03-15 DIAGNOSIS — Z3A20 20 weeks gestation of pregnancy: Secondary | ICD-10-CM

## 2019-03-15 NOTE — Progress Notes (Signed)
Virtual Routine OB   CC: None

## 2019-03-15 NOTE — Progress Notes (Signed)
TELEHEALTH OBSTETRICS PRENATAL VIRTUAL VIDEO VISIT ENCOUNTER NOTE  Provider location: Center for [facility] [place]   I connected with Sherri Schmidt on 03/15/19 at 10:00 AM EDT by WebEx OB Video Encounter at home and verified that I am speaking with the correct person using two identifiers.   I discussed the limitations, risks, security and privacy concerns of performing an evaluation and management service virtually and the availability of in person appointments. I also discussed with the patient that there may be a patient responsible charge related to this service. The patient expressed understanding and agreed to proceed. Subjective:  Sherri Schmidt is a 28 y.o. G3P1011 at [redacted]w[redacted]d being seen today for ongoing prenatal care.  She is currently monitored for the following issues for this low-risk pregnancy and has Supervision of other normal pregnancy, antepartum and Alpha thalassemia silent carrier on their problem list.  Patient reports no complaints.  Contractions: Not present. Vag. Bleeding: None.  Movement: Present. Denies any leaking of fluid.   The following portions of the patient's history were reviewed and updated as appropriate: allergies, current medications, past family history, past medical history, past social history, past surgical history and problem list.   Objective:  There were no vitals filed for this visit.  Fetal Status:     Movement: Present     General:  Alert, oriented and cooperative. Patient is in no acute distress.  Respiratory: Normal respiratory effort, no problems with respiration noted  Mental Status: Normal mood and affect. Normal behavior. Normal judgment and thought content.  Rest of physical exam deferred due to type of encounter  Imaging: [place] Mfm Ob Comp + 14 Wk  Result Date: 03/08/2019 ----------------------------------------------------------------------  OBSTETRICS REPORT                        (Signed Final 03/08/2019 06:34 am)  ---------------------------------------------------------------------- Patient Info  ID #:       [id number]                          D.O.B.:  [birth year] (28 yrs)  Name:       Sherri Schmidt [facility]               Visit Date: 03/07/2019 10:46 am ---------------------------------------------------------------------- Performed By  Performed By:     Sandi Mealy        Ref. Address:      Faculty Practice                    RDMS  Attending:        Lin Landsman      [facility]                    MD                                        [facility]etal [facility]  Referred By:      Gerrit Heck                    CNM ---------------------------------------------------------------------- Orders   #  Description                          Code  Ordered By   1  US MFM OB COMP + 14 WK               [id number]     JESSICA EMLY  ----------------------------------------------------------------------   #  Order #                    Accession #                 Episode #   1  [id number]                  [phone]                  [id number]  ---------------------------------------------------------------------- Indications   [redacted] weeks gestation of pregnancy                Z3A.18   Encounter for antenatal screening for          Z36.3   malformations   Silent Carrier Alpha Thal  ---------------------------------------------------------------------- Vital Signs  Weight (lb): 176                               Height:        5'1"  BMI:         33.25 ---------------------------------------------------------------------- Fetal Evaluation  Num Of Fetuses:          1  Fetal Heart Rate(bpm):   143  Cardiac Activity:        Observed  Presentation:            Transverse, head to maternal right  Placenta:                Posterior  P. Cord Insertion:       Visualized  Amniotic Fluid  AFI FV:      Within normal limits                              Largest Pocket(cm)                              6.82  ---------------------------------------------------------------------- Biometry  BPD:      45.7  mm     G. Age:  19w 6d         86  %    CI:        76.45   %    70 - 86                                                          FL/HC:       16.1  %    16.1 - 18.3  HC:      165.6  mm     G. Age:  19w 2d         63  %    HC/AC:       1.17       1.09 - 1.39  AC:      141.3  mm     G. Age:  19w 3d         68  %    FL/BPD:  58.2  %  FL:       26.6  mm     G. Age:  18w 1d         18  %    FL/AC:       18.8  %    20 - 24  HUM:      27.8  mm     G. Age:  18w 6d         53  %  CER:      21.1  mm     G. Age:  20w 1d         79  %  NFT:       3.6  mm  LV:        4.9  mm  CM:        3.3  mm  Est. FW:     264   gm     0 lb 9 oz     49  % ---------------------------------------------------------------------- OB History  Gravidity:    3         Term:   1  TOP:          1        Living:  1 ---------------------------------------------------------------------- Gestational Age  U/S Today:     19w 1d                                        EDD:   07/31/19  Best:          18w 6d     Det. By:  Previous Ultrasound      EDD:   08/02/19                                      (01/05/19) ---------------------------------------------------------------------- Anatomy  Cranium:               Appears normal         Aortic Arch:            Appears normal  Cavum:                 Appears normal         Ductal Arch:            Appears normal  Ventricles:            Appears normal         Diaphragm:              Not well visualized  Choroid Plexus:        Appears normal         Stomach:                Appears normal, left                                                                        sided  Cerebellum:            Appears normal  Abdomen:                Appears normal  Posterior Fossa:       Appears normal         Abdominal Wall:         Appears nml (cord                                                                        insert, abd  wall)  Nuchal Fold:           Appears normal         Cord Vessels:           Not well visualized  Face:                  Orbits appear          Kidneys:                Appear normal                         normal  Lips:                  Appears normal         Bladder:                Not well visualized  Thoracic:              Appears normal         Spine:                  Ltd views no                                                                        intracranial signs of                                                                        NTD  Heart:                 Not well visualized    Upper Extremities:      Appears normal  RVOT:                  Not well visualized    Lower Extremities:      Appears normal  LVOT:                  Not well visualized  Other:  Female gender. Technically difficult due to maternal habitus and fetal          position. ---------------------------------------------------------------------- Impression  Normal interval growth.  No ultrasonic evidence of structural  fetal anomalies.  Suboptimal views of the fetal anatomy secondary to fetal  position ---------------------------------------------------------------------- Recommendations  Follow up anatomy in 4 weeks. ----------------------------------------------------------------------               Sander Nephew, MD Electronically Signed Final Report   03/08/2019 06:34 am ----------------------------------------------------------------------   Assessment and Plan:  Pregnancy: [id number] at [redacted]w[redacted]d 1. Supervision of other normal pregnancy, antepartum  2. Trichomonas infection, treated - TOC cultures at 28 weeks  3. Alpha thalassemia silent carrier   Preterm labor symptoms and general obstetric precautions including but not limited to vaginal bleeding, contractions, leaking of fluid and fetal movement were reviewed in detail with the patient. I discussed the assessment and treatment plan with the patient. The patient was  provided an opportunity to ask questions and all were answered. The patient agreed with the plan and demonstrated an understanding of the instructions. The patient was advised to call back or seek an in-person office evaluation/go to MAU at [facility] for any urgent or concerning symptoms. Please refer to After Visit Summary for other counseling recommendations.   I provided 10 minutes of face-to-face time during this encounter.  Return in about 4 weeks (around 04/12/2019) for WebEx.  Future Appointments  Date Time Provider [place]  04/04/2019 11:00 AM [place] MFC-US  04/04/2019 11:00 AM WH-MFC [place] 3 WH-MFCUS MFC-US    Lamona Eimer, [place] for [facility] [facility] 03/15/2019

## 2019-04-04 ENCOUNTER — Other Ambulatory Visit: Payer: Self-pay

## 2019-04-04 ENCOUNTER — Ambulatory Visit (HOSPITAL_COMMUNITY): Payer: BC Managed Care – PPO

## 2019-04-04 ENCOUNTER — Ambulatory Visit (HOSPITAL_COMMUNITY)
Admission: RE | Admit: 2019-04-04 | Discharge: 2019-04-04 | Disposition: A | Discharge status: Home / Self Care | Payer: BC Managed Care – PPO | Source: Ambulatory Visit | Attending: Obstetrics and Gynecology | Admitting: Obstetrics and Gynecology

## 2019-04-04 DIAGNOSIS — Z362 Encounter for other antenatal screening follow-up: Secondary | ICD-10-CM | POA: Insufficient documentation

## 2019-04-04 DIAGNOSIS — Z3A22 22 weeks gestation of pregnancy: Secondary | ICD-10-CM

## 2019-04-12 ENCOUNTER — Ambulatory Visit (INDEPENDENT_AMBULATORY_CARE_PROVIDER_SITE_OTHER): Payer: BC Managed Care – PPO | Admitting: Obstetrics

## 2019-04-12 ENCOUNTER — Encounter: Payer: Self-pay | Admitting: Obstetrics

## 2019-04-12 DIAGNOSIS — Z3480 Encounter for supervision of other normal pregnancy, unspecified trimester: Secondary | ICD-10-CM

## 2019-04-12 DIAGNOSIS — Z3482 Encounter for supervision of other normal pregnancy, second trimester: Secondary | ICD-10-CM

## 2019-04-12 DIAGNOSIS — Z3A24 24 weeks gestation of pregnancy: Secondary | ICD-10-CM

## 2019-04-12 NOTE — Progress Notes (Signed)
TELEHEALTH OBSTETRICS PRENATAL VIRTUAL VIDEO VISIT ENCOUNTER NOTE  Provider location: Center for [facility] at [place]   I connected with Sherri Schmidt on 04/12/19 at  2:00 PM EDT by WebEx OB  Video Encounter at home and verified that I am speaking with the correct person using two identifiers.   I discussed the limitations, risks, security and privacy concerns of performing an evaluation and management service virtually and the availability of in person appointments. I also discussed with the patient that there may be a patient responsible charge related to this service. The patient expressed understanding and agreed to proceed. Subjective:  Sherri Schmidt is a 28 y.o. G3P1011 at [redacted]w[redacted]d being seen today for ongoing prenatal care.  She is currently monitored for the following issues for this low-risk pregnancy and has Supervision of other normal pregnancy, antepartum and Alpha thalassemia silent carrier on their problem list.  Patient reports heartburn.  Contractions: Not present.  .  Movement: Present. Denies any leaking of fluid.   The following portions of the patient's history were reviewed and updated as appropriate: allergies, current medications, past family history, past medical history, past social history, past surgical history and problem list.   Objective:  There were no vitals filed for this visit.  Fetal Status:     Movement: Present     General:  Alert, oriented and cooperative. Patient is in no acute distress.  Respiratory: Normal respiratory effort, no problems with respiration noted  Mental Status: Normal mood and affect. Normal behavior. Normal judgment and thought content.  Rest of physical exam deferred due to type of encounter  Imaging: [place] Mfm Ob Follow Up  Result Date: 04/05/2019 ----------------------------------------------------------------------  OBSTETRICS REPORT                        (Signed Final 04/05/2019 07:29 am)  ---------------------------------------------------------------------- Patient Info  ID #:       [id number]                          D.O.B.:  09/18/1990 (28 yrs)  Name:       Sherri Schmidt [facility]               Visit Date: 04/04/2019 11:23 am ---------------------------------------------------------------------- Performed By  Performed By:     Percell Boston          Ref. Address:      Faculty Practice                    RDMS  Attending:        Lin Landsman      [facility]                    MD                                        Fetal Care  Referred By:      Gerrit Heck                    CNM ---------------------------------------------------------------------- Orders   #  Description                          Code  Ordered By   1  [place] MFM OB FOLLOW UP                  09811.91     Lin Landsman  ----------------------------------------------------------------------   #  Order #                    Accession #                 Episode #   1  [id number]                  [id number]                  [id number]  ---------------------------------------------------------------------- Indications   [redacted] weeks gestation of pregnancy                Z3A.22   Encounter for other antenatal screening        Z36.2   follow-up( low risk NIPS, 6.0FF)   Silent Carrier Alpha Thal  ---------------------------------------------------------------------- Vital Signs                                                 Height:        5'1" ---------------------------------------------------------------------- Fetal Evaluation  Num Of Fetuses:          1  Fetal Heart Rate(bpm):   136  Cardiac Activity:        Observed  Presentation:            Breech  Placenta:                Posterior  P. Cord Insertion:       Previously Visualized  Amniotic Fluid  AFI FV:      Within normal limits                              Largest Pocket(cm)                               4.15 ---------------------------------------------------------------------- Biometry  BPD:      55.4  mm     G. Age:  22w 6d         47  %    CI:        73.45   %    70 - 86                                                          FL/HC:       18.2  %    19.2 - 20.8  HC:      205.4  mm     G. Age:  22w 5d  28  %    HC/AC:       1.11       1.05 - 1.21  AC:      185.7  mm     G. Age:  23w 3d         58  %    FL/BPD:      67.5  %    71 - 87  FL:       37.4  mm     G. Age:  21w 6d         13  %    FL/AC:       20.1  %    20 - 24  HUM:      35.1  mm     G. Age:  22w 0d         27  %  CER:      25.1  mm     G. Age:  23w 1d         54  %  CM:          6  mm  Est. FW:     529   gm     1 lb 3 oz     36  % ---------------------------------------------------------------------- OB History  Gravidity:    3         Term:   1  TOP:          1        Living:  1 ---------------------------------------------------------------------- Gestational Age  U/S Today:     22w 5d                                        EDD:   08/03/19  Best:          22w 6d     Det. By:  Previous Ultrasound      EDD:   08/02/19                                      (01/05/19) ---------------------------------------------------------------------- Anatomy  Cranium:               Appears normal         Aortic Arch:            Previously seen  Cavum:                 Appears normal         Ductal Arch:            Previously seen  Ventricles:            Appears normal         Diaphragm:              Appears normal  Choroid Plexus:        Appears normal         Stomach:                Appears normal, left  sided  Cerebellum:            Appears normal         Abdomen:                Appears normal  Posterior Fossa:       Appears normal         Abdominal Wall:         Appears nml (cord                                                                        insert, abd wall)  Nuchal Fold:            Previously seen        Cord Vessels:           Appears normal (3                                                                        vessel cord)  Face:                  Appears normal         Kidneys:                Appear normal                         (orbits and profile)  Lips:                  Appears normal         Bladder:                Appears normal  Thoracic:              Appears normal         Spine:                  Limited views                                                                        previously seen  Heart:                 Appears normal         Upper Extremities:      Previously seen                         (4CH, axis, and                         situs)  RVOT:  Appears normal         Lower Extremities:      Previously seen  LVOT:                  Appears normal  Other:  Female gender. Technically difficult due to maternal habitus and fetal          position. ---------------------------------------------------------------------- Cervix Uterus Adnexa  Cervix  Length:            3.1  cm.  Normal appearance by transabdominal scan.  Uterus  No abnormality visualized.  Left Ovary  No adnexal mass visualized.  Right Ovary  No adnexal mass visualized.  Cul De Sac  No free fluid seen.  Adnexa  No abnormality visualized. ---------------------------------------------------------------------- Impression  Normal interval growth.  Anatomy cleared today ---------------------------------------------------------------------- Recommendations  Follow up as clinically indicated. ----------------------------------------------------------------------               Sander Nephew, MD Electronically Signed Final Report   04/05/2019 07:29 am ----------------------------------------------------------------------   Assessment and Plan:  Pregnancy: G3P1011 at [redacted]w[redacted]d 1. Supervision of other normal pregnancy, antepartum   Preterm labor symptoms and general obstetric precautions including  but not limited to vaginal bleeding, contractions, leaking of fluid and fetal movement were reviewed in detail with the patient. I discussed the assessment and treatment plan with the patient. The patient was provided an opportunity to ask questions and all were answered. The patient agreed with the plan and demonstrated an understanding of the instructions. The patient was advised to call back or seek an in-person office evaluation/go to MAU at [facility] for any urgent or concerning symptoms. Please refer to After Visit Summary for other counseling recommendations.   I provided 10 minutes of face-to-face time during this encounter.  Return in about 4 weeks (around 05/10/2019) for ROB, 2 hour OGTT.    Baltazar Najjar, MD Center for [facility] 04/12/2019

## 2019-04-12 NOTE — Progress Notes (Signed)
ROB   CC: None    

## 2019-05-04 ENCOUNTER — Telehealth: Payer: Self-pay

## 2019-05-04 NOTE — Telephone Encounter (Signed)
Returned call, pt complains of soreness under left breast. Pt reports sore to touch and sore with movement. S/w Dr. Jodi Mourning and advised pt to take tylenol and use warm compresses, advised to call back if symptoms get worse. Pt agreed.

## 2019-05-12 ENCOUNTER — Other Ambulatory Visit: Payer: Self-pay

## 2019-05-12 ENCOUNTER — Ambulatory Visit (INDEPENDENT_AMBULATORY_CARE_PROVIDER_SITE_OTHER): Payer: BC Managed Care – PPO

## 2019-05-12 ENCOUNTER — Other Ambulatory Visit: Payer: BC Managed Care – PPO

## 2019-05-12 VITALS — BP 109/72 | HR 84 | Wt 183.0 lb

## 2019-05-12 DIAGNOSIS — M549 Dorsalgia, unspecified: Secondary | ICD-10-CM

## 2019-05-12 DIAGNOSIS — O26893 Other specified pregnancy related conditions, third trimester: Secondary | ICD-10-CM

## 2019-05-12 DIAGNOSIS — Z3480 Encounter for supervision of other normal pregnancy, unspecified trimester: Secondary | ICD-10-CM

## 2019-05-12 DIAGNOSIS — Z3A28 28 weeks gestation of pregnancy: Secondary | ICD-10-CM

## 2019-05-12 MED ORDER — MISC. DEVICES MISC: 0 refills | Status: DC

## 2019-05-12 NOTE — Progress Notes (Signed)
   PRENATAL VISIT NOTE  Subjective:  Sherri Schmidt is a 28 y.o. G3P1011 at [redacted]w[redacted]d who presents today for routine prenatal care.  She is currently being monitored for supervision of a low-risk pregnancy with problems as listed below.  Patient reports some back pain that she experiences intermittently as well as some BH contractions.  She states she experiences these symptoms primarily while working [employer]) and admits that she does not drink lots of water.  She endorses fetal movement and denies vaginal concerns.  Patient Active Problem List   Diagnosis Date Noted  . Alpha thalassemia silent carrier 02/14/2019  . Supervision of other normal pregnancy, antepartum 01/10/2019    The following portions of the patient's history were reviewed and updated as appropriate: allergies, current medications, past family history, past medical history, past social history, past surgical history and problem list. Problem list updated.  Objective:   Vitals:   05/12/19 0953  BP: 109/72  Pulse: 84  Weight: 183 lb (83 kg)    Fetal Status: Fetal Heart Rate (bpm): 140 Fundal Height: 27 cm Movement: Present     General:  Alert, oriented and cooperative. Patient is in no acute distress.  Skin: Skin is warm and dry.   Cardiovascular: Regular rate and rhythm.  Respiratory: Normal respiratory effort. CTA-Bilaterally  Abdomen: Soft, gravid, appropriate for gestational age.  Pelvic: Cervical exam deferred        Extremities: Normal range of motion.     Mental Status: Normal mood and affect. Normal behavior. Normal judgment and thought content.   Assessment and Plan:  Pregnancy: G3P1011 at [redacted]w[redacted]d  1. Supervision of other normal pregnancy, antepartum -Anticipatory guidance for upcoming appts. -Discussed testing today and turnover time. -Informed that results will be released to mychart and that education would be necessary for abnormal GTT.  - 2 Hour GTT - CBC - RPR - HIV antibody   2.  Back pain during pregnancy in third trimester -Reviewed usage of belly support bands for back support. -Patient requests rx for belly support band. Rx given -Discussed frequent breaks while working and proper hydration throughout the day.   Preterm labor symptoms and general obstetric precautions including but not limited to vaginal bleeding, contractions, leaking of fluid and fetal movement were reviewed with the patient.  Please refer to After Visit Summary for other counseling recommendations.  Return in about 4 weeks (around 06/09/2019), or LR, for LR-ROB via Virtual Visit.  Future Appointments  Date Time Provider [place]  06/09/2019  2:15 PM Shelly Bombard, MD [place] None    Maryann Conners, CNM 05/12/2019, 11:01 AM

## 2019-05-12 NOTE — Progress Notes (Signed)
ROB 2hr GTT today Tdap: Declined   CC: None

## 2019-05-13 ENCOUNTER — Other Ambulatory Visit (HOSPITAL_COMMUNITY): Payer: Self-pay

## 2019-05-13 DIAGNOSIS — O99013 Anemia complicating pregnancy, third trimester: Secondary | ICD-10-CM

## 2019-05-13 LAB — GLUCOSE TOLERANCE, 2 HOURS W/ 1HR
Glucose, 1 hour: 163 mg/dL (ref 65–179)
Glucose, 2 hour: 157 mg/dL — ABNORMAL HIGH (ref 65–152)
Glucose, Fasting: 83 mg/dL (ref 65–91)

## 2019-05-13 LAB — CBC
Hematocrit: 31.7 % — ABNORMAL LOW (ref 34.0–46.6)
Hemoglobin: 10.6 g/dL — ABNORMAL LOW (ref 11.1–15.9)
MCH: 28.8 pg (ref 26.6–33.0)
MCHC: 33.4 g/dL (ref 31.5–35.7)
MCV: 86 fL (ref 79–97)
Platelets: 172 10*3/uL (ref 150–450)
RBC: 3.68 x10E6/uL — ABNORMAL LOW (ref 3.77–5.28)
RDW: 12.6 % (ref 11.7–15.4)
WBC: 6.2 10*3/uL (ref 3.4–10.8)

## 2019-05-13 LAB — HIV ANTIBODY (ROUTINE TESTING W REFLEX): HIV Screen 4th Generation wRfx: NONREACTIVE

## 2019-05-13 LAB — RPR: RPR Ser Ql: NONREACTIVE

## 2019-05-13 MED ORDER — FERROUS SULFATE 325 (65 FE) MG PO TABS: 325.0000 mg | ORAL_TABLET | Freq: Every day | ORAL | 2 refills | Status: DC

## 2019-05-16 ENCOUNTER — Other Ambulatory Visit: Payer: Self-pay

## 2019-05-16 DIAGNOSIS — O24419 Gestational diabetes mellitus in pregnancy, unspecified control: Secondary | ICD-10-CM

## 2019-05-16 MED ORDER — ACCU-CHEK GUIDE W/DEVICE KIT: 1.0000 | PACK | Freq: Four times a day (QID) | 0 refills | Status: DC

## 2019-05-16 MED ORDER — ACCU-CHEK FASTCLIX LANCETS MISC: 1.0000 [IU] | Freq: Four times a day (QID) | 12 refills | Status: DC

## 2019-05-16 MED ORDER — ACCU-CHEK GUIDE VI STRP: ORAL_STRIP | 12 refills | Status: DC

## 2019-05-16 NOTE — Progress Notes (Signed)
Rx for GDM supplies sent per provider.

## 2019-05-23 ENCOUNTER — Telehealth: Payer: Self-pay | Admitting: Family Medicine

## 2019-05-23 NOTE — Telephone Encounter (Signed)
Attempted to call patient about her appointment on 11/17 @ 8:15. No answer left voicemail instructing patient to wear a face mask for the entire appointment and no visitors are allowed during the visit. Patient instructed not to attend the appointment if she was any symptoms. Symptom list and office number left.

## 2019-05-24 ENCOUNTER — Encounter: Payer: BC Managed Care – PPO | Attending: Obstetrics and Gynecology | Admitting: *Deleted

## 2019-05-24 ENCOUNTER — Other Ambulatory Visit: Payer: Self-pay

## 2019-05-24 ENCOUNTER — Ambulatory Visit: Payer: BC Managed Care – PPO | Admitting: *Deleted

## 2019-05-24 DIAGNOSIS — O24419 Gestational diabetes mellitus in pregnancy, unspecified control: Secondary | ICD-10-CM | POA: Insufficient documentation

## 2019-05-24 DIAGNOSIS — Z3A00 Weeks of gestation of pregnancy not specified: Secondary | ICD-10-CM | POA: Diagnosis not present

## 2019-05-24 NOTE — Progress Notes (Signed)
  Patient was seen on 05/24/2019 for Gestational Diabetes self-management. EDD 07/30/2019. Patient states no history of GDM. She states she works at [employer] in the [employer] from 7 AM to 4 PM.  Diet history obtained. Patient eats poor variety of all food groups with most meals coming from fast food restaurants. She cooks the evening meal at home on occasion.. Beverages include OJ, sweet tea and regular sodas, very little water.  The following learning objectives were met by the patient :   States the definition of Gestational Diabetes  States why dietary management is important in controlling blood glucose  Describes the effects of carbohydrates on blood glucose levels  Demonstrates ability to create a balanced meal plan  Demonstrates carbohydrate counting   States when to check blood glucose levels  Demonstrates proper blood glucose monitoring techniques  States the effect of stress and exercise on blood glucose levels  States the importance of limiting caffeine and abstaining from alcohol and smoking  Plan:  Aim for 3 Carb Choices per meal (45 grams) +/- 1 either way  Aim for 1-2 Carbs per snack Begin reading food labels for Total Carbohydrate of foods If OK with your MD, consider  increasing your activity level by walking, Arm Chair Exercises or other activity daily as tolerated Begin checking BG before breakfast and 2 hours after first bite of breakfast, lunch and dinner as directed by MD  Bring Log Book/Sheet and meter to every medical appointment OR use Baby Scripts (see below) Baby Scripts:  Patient was introduced to [employer] and plans to use as record of BG electronically  Take medication if directed by MD  Patient already has a meter: but needs instruction on how to use it. Apparently the Guide Meter came with a different lancing device so her Fast Clix Drums will not fit in the lancing device she has. I encouraged her to go back to her pharmacy and get the Fast  Clix Lancing Device. Patient instructed to test pre breakfast and 2 hours each meal as directed by MD  Patient instructed to monitor glucose levels: FBS: 60 - 95 mg/dl 2 hour: <120 mg/dl  Patient received the following handouts:  Nutrition Diabetes and Pregnancy  Carbohydrate Counting List  Patient will be seen for follow-up as needed.

## 2019-06-06 ENCOUNTER — Other Ambulatory Visit: Payer: Self-pay

## 2019-06-06 DIAGNOSIS — O24419 Gestational diabetes mellitus in pregnancy, unspecified control: Secondary | ICD-10-CM

## 2019-06-06 MED ORDER — ACCU-CHEK FASTCLIX LANCETS MISC: 1.0000 [IU] | Freq: Four times a day (QID) | 12 refills | Status: DC

## 2019-06-06 NOTE — Progress Notes (Signed)
RX refills for Accu-chex lancets sent to Pharmacy.

## 2019-06-09 ENCOUNTER — Encounter: Payer: Self-pay | Admitting: Obstetrics

## 2019-06-09 ENCOUNTER — Telehealth (INDEPENDENT_AMBULATORY_CARE_PROVIDER_SITE_OTHER): Payer: BC Managed Care – PPO | Admitting: Obstetrics

## 2019-06-09 DIAGNOSIS — O0993 Supervision of high risk pregnancy, unspecified, third trimester: Secondary | ICD-10-CM

## 2019-06-09 DIAGNOSIS — O0990 Supervision of high risk pregnancy, unspecified, unspecified trimester: Secondary | ICD-10-CM

## 2019-06-09 DIAGNOSIS — Z91199 Patient's noncompliance with other medical treatment and regimen due to unspecified reason: Secondary | ICD-10-CM

## 2019-06-09 DIAGNOSIS — O24419 Gestational diabetes mellitus in pregnancy, unspecified control: Secondary | ICD-10-CM

## 2019-06-09 DIAGNOSIS — Z3A32 32 weeks gestation of pregnancy: Secondary | ICD-10-CM

## 2019-06-09 DIAGNOSIS — Z9119 Patient's noncompliance with other medical treatment and regimen: Secondary | ICD-10-CM

## 2019-06-09 DIAGNOSIS — O99013 Anemia complicating pregnancy, third trimester: Secondary | ICD-10-CM

## 2019-06-09 MED ORDER — METFORMIN HCL ER 500 MG PO TB24: 1000.0000 mg | ORAL_TABLET | Freq: Two times a day (BID) | ORAL | 11 refills | Status: DC

## 2019-06-09 NOTE — Progress Notes (Signed)
Virtual ROB   CC: None  

## 2019-06-09 NOTE — Progress Notes (Signed)
   TELEHEALTH OBSTETRICS PRENATAL VIRTUAL VIDEO VISIT ENCOUNTER NOTE  Provider location: [facility] [place]   I connected with Sherri Schmidt on 06/09/19 at  2:15 PM EST by [facility] My[facility] Video Encounter at home and verified that I am speaking with the correct person using two identifiers.   I discussed the limitations, risks, security and privacy concerns of performing an evaluation and management service virtually and the availability of in person appointments. I also discussed with the patient that there may be a patient responsible charge related to this service. The patient expressed understanding and agreed to proceed. Subjective:  Sherri Schmidt is a 28 y.o. G3P1011 at [redacted]w[redacted]d being seen today for ongoing prenatal care.  She is currently monitored for the following issues for this high-risk pregnancy and has Supervision of other normal pregnancy, antepartum and Alpha thalassemia silent carrier on their problem list.  Patient reports backache and heartburn.  Contractions: Irritability. Vag. Bleeding: None.  Movement: Present. Denies any leaking of fluid.   The following portions of the patient's history were reviewed and updated as appropriate: allergies, current medications, past family history, past medical history, past social history, past surgical history and problem list.   Objective:  There were no vitals filed for this visit.  Fetal Status:     Movement: Present     General:  Alert, oriented and cooperative. Patient is in no acute distress.  Respiratory: Normal respiratory effort, no problems with respiration noted  Mental Status: Normal mood and affect. Normal behavior. Normal judgment and thought content.  Rest of physical exam deferred due to type of encounter  Imaging: No results found.  Assessment and Plan:  Pregnancy: G3P1011 at [redacted]w[redacted]d 1. Supervision of high risk pregnancy, antepartum  2. Gestational diabetes mellitus (GDM), antepartum,  gestational diabetes method of control unspecified.  Poor compliance with glucose monitoring.  She sates that her FBS's and postprandials are sometime high, but she is not checking her sugars as scheduled.  She would not give exact numbers.  I think that she needs to start medication and get an ultrasound for growth. Rx: - metFORMIN (GLUCOPHAGE XR) 500 MG 24 hr tablet; Take 2 tablets (1,000 mg total) by mouth 2 (two) times daily after a meal. Take after breakfast and after supper.  Dispense: 60 tablet; Refill: 11 - [place] MFM OB FOLLOW UP; Future  3. Anemia during pregnancy in third trimester - taking FeSO4  4. Patient noncompliance    Preterm labor symptoms and general obstetric precautions including but not limited to vaginal bleeding, contractions, leaking of fluid and fetal movement were reviewed in detail with the patient. I discussed the assessment and treatment plan with the patient. The patient was provided an opportunity to ask questions and all were answered. The patient agreed with the plan and demonstrated an understanding of the instructions. The patient was advised to call back or seek an in-person office evaluation/go to [facility] at [facility] for any urgent or concerning symptoms. Please refer to After Visit Summary for other counseling recommendations.   I provided 15 minutes of face-to-face time during this encounter.  Return in about 2 weeks (around 06/23/2019).    Baltazar Najjar, MD [facility] 06/09/2019

## 2019-06-14 ENCOUNTER — Other Ambulatory Visit: Payer: Self-pay

## 2019-06-20 ENCOUNTER — Other Ambulatory Visit: Payer: Self-pay

## 2019-06-20 ENCOUNTER — Ambulatory Visit (HOSPITAL_COMMUNITY)
Admission: RE | Admit: 2019-06-20 | Discharge: 2019-06-20 | Disposition: A | Discharge status: Home / Self Care | Payer: BC Managed Care – PPO | Source: Ambulatory Visit | Attending: Obstetrics and Gynecology | Admitting: Obstetrics and Gynecology

## 2019-06-20 ENCOUNTER — Other Ambulatory Visit: Payer: Self-pay | Admitting: Obstetrics

## 2019-06-20 DIAGNOSIS — Z362 Encounter for other antenatal screening follow-up: Secondary | ICD-10-CM | POA: Diagnosis not present

## 2019-06-20 DIAGNOSIS — O99013 Anemia complicating pregnancy, third trimester: Secondary | ICD-10-CM

## 2019-06-20 DIAGNOSIS — O24415 Gestational diabetes mellitus in pregnancy, controlled by oral hypoglycemic drugs: Secondary | ICD-10-CM | POA: Diagnosis not present

## 2019-06-20 DIAGNOSIS — O24419 Gestational diabetes mellitus in pregnancy, unspecified control: Secondary | ICD-10-CM

## 2019-06-20 DIAGNOSIS — Z3A33 33 weeks gestation of pregnancy: Secondary | ICD-10-CM

## 2019-06-20 NOTE — Consult Note (Signed)
This patient was seen for a follow up growth scan due to recently diagnosed A2 gestational diabetes that is currently treated with Metformin.  She reports that some of her fingerstick values are elevated.   She was informed that the fetal growth and amniotic fluid level appears appropriate for her gestational age.  A biophysical profile performed today was 8 out of 8.  The implications and management of diabetes in pregnancy was discussed in detail with the patient. She was advised that our goals for her fingerstick values are fasting values of 90-95 or less and two-hour postprandials of 120 or less.  Should the majority of her fingerstick values be above these values, the dosage of Metformin may have to be increased or she may have to be started on insulin to help her achieve better glycemic control. The patient was advised that getting her fingerstick values as close to these goals as possible would provide her with the most optimal obstetrical outcome.  Due to A2 gestational diabetes, we will continue to follow her closely with weekly biophysical profiles.  Should her fingerstick values remain uncontrolled, delivery would be recommended at between 37 to 38 weeks.    A follow-up exam was scheduled in 1 week.  A total of 15 minutes was spent counseling and coordinating the care for this patient.  Greater than 50% of the time was spent in direct face-to-face contact.

## 2019-06-21 ENCOUNTER — Other Ambulatory Visit (HOSPITAL_COMMUNITY): Payer: Self-pay | Admitting: *Deleted

## 2019-06-21 DIAGNOSIS — O24415 Gestational diabetes mellitus in pregnancy, controlled by oral hypoglycemic drugs: Secondary | ICD-10-CM

## 2019-06-22 ENCOUNTER — Other Ambulatory Visit: Payer: Self-pay

## 2019-06-22 ENCOUNTER — Telehealth (INDEPENDENT_AMBULATORY_CARE_PROVIDER_SITE_OTHER): Payer: BC Managed Care – PPO | Admitting: Family Medicine

## 2019-06-22 DIAGNOSIS — O24419 Gestational diabetes mellitus in pregnancy, unspecified control: Secondary | ICD-10-CM | POA: Insufficient documentation

## 2019-06-22 DIAGNOSIS — O0990 Supervision of high risk pregnancy, unspecified, unspecified trimester: Secondary | ICD-10-CM

## 2019-06-22 DIAGNOSIS — O0993 Supervision of high risk pregnancy, unspecified, third trimester: Secondary | ICD-10-CM

## 2019-06-22 DIAGNOSIS — Z3A34 34 weeks gestation of pregnancy: Secondary | ICD-10-CM

## 2019-06-22 HISTORY — DX: Gestational diabetes mellitus in pregnancy, unspecified control: O24.419

## 2019-06-22 NOTE — Progress Notes (Signed)
Virtual Visit via Telephone Note  I connected with Sherri Schmidt on 06/22/19 at 10:15 AM EST by telephone and verified that I am speaking with the correct person using two identified.  Unable to check BP; she misplaced BP cuff in the process of moving.  No concerns per pt

## 2019-06-22 NOTE — Progress Notes (Signed)
Patient ID: Sherri Schmidt, female   DOB: January 21, 1991, 28 y.o.   MRN: [id number]  I connected with@ on 06/22/19 at 10:15 AM EST by: MyChart and verified that I am speaking with the correct person using two identifiers.  Patient is located at home and provider is located at [facility].     The purpose of this virtual visit is to provide medical care while limiting exposure to the novel coronavirus. I discussed the limitations, risks, security and privacy concerns of performing an evaluation and management service by MyChart and the availability of in person appointments. I also discussed with the patient that there may be a patient responsible charge related to this service. By engaging in this virtual visit, you consent to the provision of healthcare.  Additionally, you authorize for your insurance to be billed for the services provided during this visit.  The patient expressed understanding and agreed to proceed.  The following staff members participated in the virtual visit:  Clarnce Flock     PRENATAL VISIT NOTE  Subjective:  Sherri Schmidt is a 28 y.o. G3P1011 at [redacted]w[redacted]d  for phone visit for ongoing prenatal care.  She is currently monitored for the following issues for this high-risk pregnancy and has Supervision of high risk pregnancy, antepartum; Alpha thalassemia silent carrier; and GDM, class A2 on their problem list.  Patient reports no bleeding, no contractions, no cramping and no leaking.  Contractions: Irregular. Vag. Bleeding: None.  Movement: Present. Denies leaking of fluid.   The following portions of the patient's history were reviewed and updated as appropriate: allergies, current medications, past family history, past medical history, past social history, past surgical history and problem list.   Objective:  There were no vitals filed for this visit. Self-Obtained  Fetal Status:     Movement: Present     Assessment and Plan:  Pregnancy: G3P1011 at [redacted]w[redacted]d 1.  Supervision of high risk pregnancy, antepartum Reports she is currently moving and does not know where her BP cuff or glucometer is Asked her to have them take BP at her next antenatal testing appt at beginnig of next week ROB visit in 2 weeks, will need swabs as well as baseline GDM labs (CBC, CMP, UPCR) and rubella screen at that time   2. GDM, class A2 Can not recall sugars at all for me, usually only checking fasting and 2hr PP lunch Only taking 500mg  Metformin BID, instructed to increase to 1000mg  BID as already prescribed Reviewed risk for stillbirth with uncontrolled GDM and importance of having data to be able to inform pregnancy management decisions Already connected w antenatal testing, growth showed 48% and 2333g and BPP 8/8 on 06/20/2019 She is aware of follow up testing scheduled for next week, asked her to bring sugar log to MFM at that time IOL at 37-38 wks if sugars uncontrolled per most recent MFM consult  Preterm labor symptoms and general obstetric precautions including but not limited to vaginal bleeding, contractions, leaking of fluid and fetal movement were reviewed in detail with the patient.  Return in 2 weeks (on 07/06/2019) for [facility], in person, needs MD.  Future Appointments  Date Time Provider [place]  06/27/2019  2:30 PM [place] MFC-US  06/27/2019  2:30 PM [place] 5 WH-MFCUS MFC-US  07/04/2019  3:00 PM [place] 3 WH-MFCUS MFC-US  07/04/2019  3:05 PM [place] MFC-US  07/12/2019  2:45 PM [place] 2 WH-MFCUS MFC-US  07/12/2019  2:50 PM [place]  NURSE WH-MFC MFC-US     Time spent on virtual visit: 13 minutes  Venora Maples, MD

## 2019-06-22 NOTE — Patient Instructions (Signed)
Group B Streptococcus Infection During Pregnancy  Group B Streptococcus (GBS) is a type of bacteria (Streptococcus agalactiae) that is often found in healthy people, commonly in the rectum, vagina, and intestines. In people who are healthy and not pregnant, the bacteria rarely cause serious illness or complications. However, women who test positive for GBS during pregnancy can pass the bacteria to their baby during childbirth, which can cause serious infection in the baby after birth. Women with GBS may also have infections during their pregnancy or immediately after childbirth, such as urinary tract infections (UTIs) or infections of the uterus (uterine infections). Having GBS also increases a woman's risk of complications during pregnancy, such as early (preterm) labor or delivery, miscarriage, or stillbirth. Routine testing (screening) for GBS is recommended for all pregnant women. What increases the risk? You may have a higher risk for GBS infection during pregnancy if you had one during a past pregnancy. What are the signs or symptoms? In most cases, GBS infection does not cause symptoms in pregnant women. Signs and symptoms of a possible GBS-related infection may include:  Labor starting before the 37th week of pregnancy.  A UTI or bladder infection, which may cause: ? Fever. ? Pain or burning during urination. ? Frequent urination.  Fever during labor, along with: ? Bad-smelling discharge. ? Uterine tenderness. ? Rapid heartbeat in the mother, baby, or both. Rare but serious symptoms of a possible GBS-related infection in women include:  Blood infection (septicemia). This may cause fever, chills, or confusion.  Lung infection (pneumonia). This may cause fever, chills, cough, rapid breathing, difficulty breathing, or chest pain.  Bone, joint, skin, or soft tissue infection. How is this diagnosed? You may be screened for GBS between week 35 and week 37 of your pregnancy. If you  have symptoms of preterm labor, you may be screened earlier. This condition is diagnosed based on lab test results from:  A swab of fluid from the vagina and rectum.  A urine sample. How is this treated? This condition is treated with antibiotic medicine. When you go into labor, or as soon as your water breaks (your membranes rupture), you will be given antibiotics through an IV tube. Antibiotics will continue until after you give birth. If you are having a cesarean delivery, you do not need antibiotics unless your membranes have already ruptured. Follow these instructions at home:  Take over-the-counter and prescription medicines only as told by your health care provider.  Take your antibiotic medicine as told by your health care provider. Do not stop taking the antibiotic even if you start to feel better.  Keep all pre-birth (prenatal) visits and follow-up visits as told by your health care provider. This is important. Contact a health care provider if:  You have pain or burning when you urinate.  You have to urinate frequently.  You have a fever or chills.  You develop a bad-smelling vaginal discharge. Get help right away if:  Your membranes rupture.  You go into labor.  You have severe pain in your abdomen.  You have difficulty breathing.  You have chest pain. This information is not intended to replace advice given to you by your health care provider. Make sure you discuss any questions you have with your health care provider. Document Released: 09/30/2007 Document Revised: 10/14/2018 Document Reviewed: 01/17/2016 Elsevier Patient Education  2020 [employer].   Breastfeeding  Choosing to breastfeed is one of the best decisions you can make for yourself and your baby. A change  in hormones during pregnancy causes your breasts to make breast milk in your milk-producing glands. Hormones prevent breast milk from being released before your baby is born. They also prompt milk  flow after birth. Once breastfeeding has begun, thoughts of your baby, as well as his or her sucking or crying, can stimulate the release of milk from your milk-producing glands. Benefits of breastfeeding Research shows that breastfeeding offers many health benefits for infants and mothers. It also offers a cost-free and convenient way to feed your baby. For your baby  Your first milk (colostrum) helps your baby's digestive system to function better.  Special cells in your milk (antibodies) help your baby to fight off infections.  Breastfed babies are less likely to develop asthma, allergies, obesity, or type 2 diabetes. They are also at lower risk for sudden infant death syndrome (SIDS).  Nutrients in breast milk are better able to meet your baby's needs compared to infant formula.  Breast milk improves your baby's brain development. For you  Breastfeeding helps to create a very special bond between you and your baby.  Breastfeeding is convenient. Breast milk costs nothing and is always available at the correct temperature.  Breastfeeding helps to burn calories. It helps you to lose the weight that you gained during pregnancy.  Breastfeeding makes your uterus return faster to its size before pregnancy. It also slows bleeding (lochia) after you give birth.  Breastfeeding helps to lower your risk of developing type 2 diabetes, osteoporosis, rheumatoid arthritis, cardiovascular disease, and breast, ovarian, uterine, and endometrial cancer later in life. Breastfeeding basics Starting breastfeeding  Find a comfortable place to sit or lie down, with your neck and back well-supported.  Place a pillow or a rolled-up blanket under your baby to bring him or her to the level of your breast (if you are seated). Nursing pillows are specially designed to help support your arms and your baby while you breastfeed.  Make sure that your baby's tummy (abdomen) is facing your abdomen.  Gently massage  your breast. With your fingertips, massage from the outer edges of your breast inward toward the nipple. This encourages milk flow. If your milk flows slowly, you may need to continue this action during the feeding.  Support your breast with 4 fingers underneath and your thumb above your nipple (make the letter "C" with your hand). Make sure your fingers are well away from your nipple and your baby's mouth.  Stroke your baby's lips gently with your finger or nipple.  When your baby's mouth is open wide enough, quickly bring your baby to your breast, placing your entire nipple and as much of the areola as possible into your baby's mouth. The areola is the colored area around your nipple. ? More areola should be visible above your baby's upper lip than below the lower lip. ? Your baby's lips should be opened and extended outward (flanged) to ensure an adequate, comfortable latch. ? Your baby's tongue should be between his or her lower gum and your breast.  Make sure that your baby's mouth is correctly positioned around your nipple (latched). Your baby's lips should create a seal on your breast and be turned out (everted).  It is common for your baby to suck about 2-3 minutes in order to start the flow of breast milk. Latching Teaching your baby how to latch onto your breast properly is very important. An improper latch can cause nipple pain, decreased milk supply, and poor weight gain in your baby. Also,  if your baby is not latched onto your nipple properly, he or she may swallow some air during feeding. This can make your baby fussy. Burping your baby when you switch breasts during the feeding can help to get rid of the air. However, teaching your baby to latch on properly is still the best way to prevent fussiness from swallowing air while breastfeeding. Signs that your baby has successfully latched onto your nipple  Silent tugging or silent sucking, without causing you pain. Infant's lips should  be extended outward (flanged).  Swallowing heard between every 3-4 sucks once your milk has started to flow (after your let-down milk reflex occurs).  Muscle movement above and in front of his or her ears while sucking. Signs that your baby has not successfully latched onto your nipple  Sucking sounds or smacking sounds from your baby while breastfeeding.  Nipple pain. If you think your baby has not latched on correctly, slip your finger into the corner of your baby's mouth to break the suction and place it between your baby's gums. Attempt to start breastfeeding again. Signs of successful breastfeeding Signs from your baby  Your baby will gradually decrease the number of sucks or will completely stop sucking.  Your baby will fall asleep.  Your baby's body will relax.  Your baby will retain a small amount of milk in his or her mouth.  Your baby will let go of your breast by himself or herself. Signs from you  Breasts that have increased in firmness, weight, and size 1-3 hours after feeding.  Breasts that are softer immediately after breastfeeding.  Increased milk volume, as well as a change in milk consistency and color by the fifth day of breastfeeding.  Nipples that are not sore, cracked, or bleeding. Signs that your baby is getting enough milk  Wetting at least 1-2 diapers during the first 24 hours after birth.  Wetting at least 5-6 diapers every 24 hours for the first week after birth. The urine should be clear or pale yellow by the age of 5 days.  Wetting 6-8 diapers every 24 hours as your baby continues to grow and develop.  At least 3 stools in a 24-hour period by the age of 5 days. The stool should be soft and yellow.  At least 3 stools in a 24-hour period by the age of 7 days. The stool should be seedy and yellow.  No loss of weight greater than 10% of birth weight during the first 3 days of life.  Average weight gain of 4-7 oz (113-198 g) per week after the age  of 4 days.  Consistent daily weight gain by the age of 5 days, without weight loss after the age of 2 weeks. After a feeding, your baby may spit up a small amount of milk. This is normal. Breastfeeding frequency and duration Frequent feeding will help you make more milk and can prevent sore nipples and extremely full breasts (breast engorgement). Breastfeed when you feel the need to reduce the fullness of your breasts or when your baby shows signs of hunger. This is called "breastfeeding on demand." Signs that your baby is hungry include:  Increased alertness, activity, or restlessness.  Movement of the head from side to side.  Opening of the mouth when the corner of the mouth or cheek is stroked (rooting).  Increased sucking sounds, smacking lips, cooing, sighing, or squeaking.  Hand-to-mouth movements and sucking on fingers or hands.  Fussing or crying. Avoid introducing a pacifier  to your baby in the first 4-6 weeks after your baby is born. After this time, you may choose to use a pacifier. Research has shown that pacifier use during the first year of a baby's life decreases the risk of sudden infant death syndrome (SIDS). Allow your baby to feed on each breast as long as he or she wants. When your baby unlatches or falls asleep while feeding from the first breast, offer the second breast. Because newborns are often sleepy in the first few weeks of life, you may need to awaken your baby to get him or her to feed. Breastfeeding times will vary from baby to baby. However, the following rules can serve as a guide to help you make sure that your baby is properly fed:  Newborns (babies 464 weeks of age or younger) may breastfeed every 1-3 hours.  Newborns should not go without breastfeeding for longer than 3 hours during the day or 5 hours during the night.  You should breastfeed your baby a minimum of 8 times in a 24-hour period. Breast milk pumping     Pumping and storing breast milk  allows you to make sure that your baby is exclusively fed your breast milk, even at times when you are unable to breastfeed. This is especially important if you go back to work while you are still breastfeeding, or if you are not able to be present during feedings. Your lactation consultant can help you find a method of pumping that works best for you and give you guidelines about how long it is safe to store breast milk. Caring for your breasts while you breastfeed Nipples can become dry, cracked, and sore while breastfeeding. The following recommendations can help keep your breasts moisturized and healthy:  Avoid using soap on your nipples.  Wear a supportive bra designed especially for nursing. Avoid wearing underwire-style bras or extremely tight bras (sports bras).  Air-dry your nipples for 3-4 minutes after each feeding.  Use only cotton bra pads to absorb leaked breast milk. Leaking of breast milk between feedings is normal.  Use lanolin on your nipples after breastfeeding. Lanolin helps to maintain your skin's normal moisture barrier. Pure lanolin is not harmful (not toxic) to your baby. You may also hand express a few drops of breast milk and gently massage that milk into your nipples and allow the milk to air-dry. In the first few weeks after giving birth, some women experience breast engorgement. Engorgement can make your breasts feel heavy, warm, and tender to the touch. Engorgement peaks within 3-5 days after you give birth. The following recommendations can help to ease engorgement:  Completely empty your breasts while breastfeeding or pumping. You may want to start by applying warm, moist heat (in the shower or with warm, water-soaked hand towels) just before feeding or pumping. This increases circulation and helps the milk flow. If your baby does not completely empty your breasts while breastfeeding, pump any extra milk after he or she is finished.  Apply ice packs to your breasts  immediately after breastfeeding or pumping, unless this is too uncomfortable for you. To do this: ? Put ice in a plastic bag. ? Place a towel between your skin and the bag. ? Leave the ice on for 20 minutes, 2-3 times a day.  Make sure that your baby is latched on and positioned properly while breastfeeding. If engorgement persists after 48 hours of following these recommendations, contact your health care provider or a Advertising copywriterlactation consultant. Overall health  care recommendations while breastfeeding  Eat 3 healthy meals and 3 snacks every day. Well-nourished mothers who are breastfeeding need an additional 450-500 calories a day. You can meet this requirement by increasing the amount of a balanced diet that you eat.  Drink enough water to keep your urine pale yellow or clear.  Rest often, relax, and continue to take your prenatal vitamins to prevent fatigue, stress, and low vitamin and mineral levels in your body (nutrient deficiencies).  Do not use any products that contain nicotine or tobacco, such as cigarettes and e-cigarettes. Your baby may be harmed by chemicals from cigarettes that pass into breast milk and exposure to secondhand smoke. If you need help quitting, ask your health care provider.  Avoid alcohol.  Do not use illegal drugs or marijuana.  Talk with your health care provider before taking any medicines. These include over-the-counter and prescription medicines as well as vitamins and herbal supplements. Some medicines that may be harmful to your baby can pass through breast milk.  It is possible to become pregnant while breastfeeding. If birth control is desired, ask your health care provider about options that will be safe while breastfeeding your baby. Where to find more information: [employer]: www.llli.org Contact a health care provider if:  You feel like you want to stop breastfeeding or have become frustrated with breastfeeding.  Your nipples are  cracked or bleeding.  Your breasts are red, tender, or warm.  You have: ? Painful breasts or nipples. ? A swollen area on either breast. ? A fever or chills. ? Nausea or vomiting. ? Drainage other than breast milk from your nipples.  Your breasts do not become full before feedings by the fifth day after you give birth.  You feel sad and depressed.  Your baby is: ? Too sleepy to eat well. ? Having trouble sleeping. ? More than 66 week old and wetting fewer than 6 diapers in a 24-hour period. ? Not gaining weight by 27 days of age.  Your baby has fewer than 3 stools in a 24-hour period.  Your baby's skin or the white parts of his or her eyes become yellow. Get help right away if:  Your baby is overly tired (lethargic) and does not want to wake up and feed.  Your baby develops an unexplained fever. Summary  Breastfeeding offers many health benefits for infant and mothers.  Try to breastfeed your infant when he or she shows early signs of hunger.  Gently tickle or stroke your baby's lips with your finger or nipple to allow the baby to open his or her mouth. Bring the baby to your breast. Make sure that much of the areola is in your baby's mouth. Offer one side and burp the baby before you offer the other side.  Talk with your health care provider or lactation consultant if you have questions or you face problems as you breastfeed. This information is not intended to replace advice given to you by your health care provider. Make sure you discuss any questions you have with your health care provider. Document Released: 06/23/2005 Document Revised: 09/17/2017 Document Reviewed: 07/25/2016 Elsevier Patient Education  2020 [employer].

## 2019-06-27 ENCOUNTER — Ambulatory Visit (HOSPITAL_COMMUNITY): Payer: BC Managed Care – PPO | Admitting: *Deleted

## 2019-06-27 ENCOUNTER — Ambulatory Visit (HOSPITAL_COMMUNITY)
Admission: RE | Admit: 2019-06-27 | Discharge: 2019-06-27 | Disposition: A | Discharge status: Home / Self Care | Payer: BC Managed Care – PPO | Source: Ambulatory Visit | Attending: Obstetrics | Admitting: Obstetrics

## 2019-06-27 ENCOUNTER — Other Ambulatory Visit: Payer: Self-pay

## 2019-06-27 ENCOUNTER — Encounter (HOSPITAL_COMMUNITY): Payer: Self-pay | Admitting: *Deleted

## 2019-06-27 ENCOUNTER — Other Ambulatory Visit (HOSPITAL_COMMUNITY): Payer: Self-pay | Admitting: *Deleted

## 2019-06-27 VITALS — BP 109/62 | HR 94 | Temp 96.8°F

## 2019-06-27 DIAGNOSIS — O24410 Gestational diabetes mellitus in pregnancy, diet controlled: Secondary | ICD-10-CM | POA: Insufficient documentation

## 2019-06-27 DIAGNOSIS — Z3A34 34 weeks gestation of pregnancy: Secondary | ICD-10-CM | POA: Diagnosis not present

## 2019-06-27 DIAGNOSIS — O24415 Gestational diabetes mellitus in pregnancy, controlled by oral hypoglycemic drugs: Secondary | ICD-10-CM

## 2019-06-27 DIAGNOSIS — O99013 Anemia complicating pregnancy, third trimester: Secondary | ICD-10-CM | POA: Diagnosis not present

## 2019-07-04 ENCOUNTER — Encounter (HOSPITAL_COMMUNITY): Payer: Self-pay | Admitting: *Deleted

## 2019-07-04 ENCOUNTER — Ambulatory Visit (HOSPITAL_COMMUNITY)
Admission: RE | Admit: 2019-07-04 | Discharge: 2019-07-04 | Disposition: A | Discharge status: Home / Self Care | Payer: BC Managed Care – PPO | Source: Ambulatory Visit | Attending: Obstetrics | Admitting: Obstetrics

## 2019-07-04 ENCOUNTER — Ambulatory Visit (HOSPITAL_COMMUNITY): Payer: BC Managed Care – PPO | Admitting: *Deleted

## 2019-07-04 ENCOUNTER — Other Ambulatory Visit: Payer: Self-pay

## 2019-07-04 VITALS — BP 102/65 | HR 86 | Temp 97.5°F

## 2019-07-04 DIAGNOSIS — O24415 Gestational diabetes mellitus in pregnancy, controlled by oral hypoglycemic drugs: Secondary | ICD-10-CM

## 2019-07-04 DIAGNOSIS — Z3A35 35 weeks gestation of pregnancy: Secondary | ICD-10-CM

## 2019-07-04 DIAGNOSIS — O99013 Anemia complicating pregnancy, third trimester: Secondary | ICD-10-CM

## 2019-07-07 ENCOUNTER — Ambulatory Visit (INDEPENDENT_AMBULATORY_CARE_PROVIDER_SITE_OTHER): Payer: BC Managed Care – PPO | Admitting: Obstetrics and Gynecology

## 2019-07-07 ENCOUNTER — Encounter: Payer: Self-pay | Admitting: Obstetrics and Gynecology

## 2019-07-07 ENCOUNTER — Other Ambulatory Visit (HOSPITAL_COMMUNITY)
Admission: RE | Admit: 2019-07-07 | Discharge: 2019-07-07 | Disposition: A | Discharge status: Home / Self Care | Payer: BC Managed Care – PPO | Source: Ambulatory Visit | Attending: Obstetrics and Gynecology | Admitting: Obstetrics and Gynecology

## 2019-07-07 ENCOUNTER — Other Ambulatory Visit: Payer: Self-pay

## 2019-07-07 VITALS — BP 112/74 | HR 92 | Wt 182.4 lb

## 2019-07-07 DIAGNOSIS — O0990 Supervision of high risk pregnancy, unspecified, unspecified trimester: Secondary | ICD-10-CM | POA: Diagnosis present

## 2019-07-07 DIAGNOSIS — Z3A36 36 weeks gestation of pregnancy: Secondary | ICD-10-CM

## 2019-07-07 DIAGNOSIS — O0993 Supervision of high risk pregnancy, unspecified, third trimester: Secondary | ICD-10-CM

## 2019-07-07 DIAGNOSIS — O24419 Gestational diabetes mellitus in pregnancy, unspecified control: Secondary | ICD-10-CM

## 2019-07-07 NOTE — Progress Notes (Signed)
   PRENATAL VISIT NOTE  Subjective:  Sherri Schmidt is a 28 y.o. G3P1011 at [redacted]w[redacted]d being seen today for ongoing prenatal care.  She is currently monitored for the following issues for this high-risk pregnancy and has Supervision of high risk pregnancy, antepartum; Alpha thalassemia silent carrier; and GDM, class A2 on their problem list.  Patient reports no complaints.  Contractions: Irritability. Vag. Bleeding: None.  Movement: Present. Denies leaking of fluid.   The following portions of the patient's history were reviewed and updated as appropriate: allergies, current medications, past family history, past medical history, past social history, past surgical history and problem list.   Objective:   Vitals:   07/07/19 1048  BP: 112/74  Pulse: 92  Weight: 182 lb 6.4 oz (82.7 kg)    Fetal Status: Fetal Heart Rate (bpm): 140 Fundal Height: 37 cm Movement: Present  Presentation: Vertex  General:  Alert, oriented and cooperative. Patient is in no acute distress.  Skin: Skin is warm and dry. No rash noted.   Cardiovascular: Normal heart rate noted  Respiratory: Normal respiratory effort, no problems with respiration noted  Abdomen: Soft, gravid, appropriate for gestational age.  Pain/Pressure: Absent     Pelvic: Cervical exam performed Dilation: Fingertip Effacement (%): Thick Station: Ballotable  Extremities: Normal range of motion.  Edema: None  Mental Status: Normal mood and affect. Normal behavior. Normal judgment and thought content.   Assessment and Plan:  Pregnancy: G3P1011 at [redacted]w[redacted]d 1. Supervision of high risk pregnancy, antepartum Patient is doing well Planning depo-provera for contraception Cultures today  2. GDM, class A2 Patient was not able to check CBGs last week due to broken meter Pp last night was 98 Will continue metformin 1000 mg BID Follow up growth scan with MFM   Preterm labor symptoms and general obstetric precautions including but not limited to vaginal  bleeding, contractions, leaking of fluid and fetal movement were reviewed in detail with the patient. Please refer to After Visit Summary for other counseling recommendations.   Return in about 1 week (around 07/14/2019) for Virtual, ROB, High risk.  Future Appointments  Date Time Provider [place]  07/12/2019  2:45 PM [place] 2 WH-MFCUS MFC-US  07/12/2019  2:50 PM [place] MFC-US  07/19/2019  2:30 PM [place] MFC-US  07/19/2019  2:30 PM [place] 1 WH-MFCUS MFC-US  07/26/2019  2:15 PM [place] MFC-US  07/26/2019  2:15 PM [place] 4 WH-MFCUS MFC-US    Mora Bellman, MD

## 2019-07-07 NOTE — Progress Notes (Signed)
Pt is here for ROB. [redacted]w[redacted]d.  

## 2019-07-08 NOTE — L&D Delivery Note (Addendum)
Delivery Note At 2:54 AM a viable and healthy female was delivered via Vaginal, Spontaneous (Presentation: Right Occiput Anterior). No nuchal cord.  APGAR: 8, 9; weight pending.   Placenta status: Spontaneous, Intact.  Cord: 3 vessels with the following complications: None.   Anesthesia: Epidural Episiotomy:  None Lacerations:  None Suture Repair: N/A Est. Blood Loss (mL): 106 mL  Mom to postpartum.  Baby to Nursery.  Shirlean Mylar 07/24/2019, 3:12 AM  OB FELLOW DELIVERY ATTESTATION  I was gloved and present for the delivery in its entirety, and I agree with the above resident's note. IOL for GDMA2. Initial SVE 1/60/-3. Patient received Cytotec, Foley bulb, Pitocin and AROM. Received epidural and progressed to complete.   Jerilynn Birkenhead, MD [facility] Family Medicine Fellow, [facility] for [employer], [facility]

## 2019-07-10 LAB — CULTURE, BETA STREP (GROUP B ONLY): Strep Gp B Culture: POSITIVE — AB

## 2019-07-11 ENCOUNTER — Encounter: Payer: Self-pay | Admitting: Obstetrics and Gynecology

## 2019-07-11 DIAGNOSIS — O99820 Streptococcus B carrier state complicating pregnancy: Secondary | ICD-10-CM | POA: Insufficient documentation

## 2019-07-11 HISTORY — DX: Streptococcus B carrier state complicating pregnancy: O99.820

## 2019-07-11 LAB — CERVICOVAGINAL ANCILLARY ONLY
Chlamydia: NEGATIVE
Comment: NEGATIVE
Comment: NORMAL
Neisseria Gonorrhea: NEGATIVE

## 2019-07-12 ENCOUNTER — Ambulatory Visit (HOSPITAL_COMMUNITY): Payer: BC Managed Care – PPO

## 2019-07-12 ENCOUNTER — Other Ambulatory Visit: Payer: Self-pay

## 2019-07-12 DIAGNOSIS — O24419 Gestational diabetes mellitus in pregnancy, unspecified control: Secondary | ICD-10-CM

## 2019-07-12 MED ORDER — ACCU-CHEK GUIDE W/DEVICE KIT: 1.0000 | PACK | Freq: Four times a day (QID) | 0 refills | Status: DC

## 2019-07-14 ENCOUNTER — Encounter (HOSPITAL_COMMUNITY): Payer: Self-pay

## 2019-07-14 ENCOUNTER — Other Ambulatory Visit: Payer: Self-pay

## 2019-07-14 ENCOUNTER — Ambulatory Visit (HOSPITAL_COMMUNITY): Payer: BC Managed Care – PPO | Admitting: *Deleted

## 2019-07-14 ENCOUNTER — Ambulatory Visit (HOSPITAL_COMMUNITY): Payer: BC Managed Care – PPO | Attending: Obstetrics and Gynecology | Admitting: *Deleted

## 2019-07-14 ENCOUNTER — Encounter: Payer: BC Managed Care – PPO | Admitting: Obstetrics & Gynecology

## 2019-07-14 DIAGNOSIS — O24415 Gestational diabetes mellitus in pregnancy, controlled by oral hypoglycemic drugs: Secondary | ICD-10-CM | POA: Diagnosis present

## 2019-07-14 DIAGNOSIS — Z3A37 37 weeks gestation of pregnancy: Secondary | ICD-10-CM | POA: Diagnosis not present

## 2019-07-14 DIAGNOSIS — O0990 Supervision of high risk pregnancy, unspecified, unspecified trimester: Secondary | ICD-10-CM

## 2019-07-14 DIAGNOSIS — O99820 Streptococcus B carrier state complicating pregnancy: Secondary | ICD-10-CM

## 2019-07-14 NOTE — Procedures (Signed)
Sherri Schmidt Jan 10, 1991 [redacted]w[redacted]d  Fetus A Non-Stress Test Interpretation for 07/14/19  Indication: Gestational Diabetes medication controlled  Fetal Heart Rate A Mode: External Baseline Rate (A): 150 bpm Variability: Moderate Accelerations: 15 x 15 Decelerations: None Multiple birth?: No  Uterine Activity Mode: Palpation, Toco Contraction Frequency (min): x1 wtih U/I Contraction Duration (sec): 140 Contraction Quality: Mild Resting Tone Palpated: Relaxed Resting Time: Adequate  Interpretation (Fetal Testing) Nonstress Test Interpretation: Reactive Comments: Reviewed tracing with Dr. Judeth Cornfield

## 2019-07-15 ENCOUNTER — Telehealth (HOSPITAL_COMMUNITY): Payer: Self-pay | Admitting: *Deleted

## 2019-07-15 ENCOUNTER — Telehealth (INDEPENDENT_AMBULATORY_CARE_PROVIDER_SITE_OTHER): Payer: BC Managed Care – PPO | Admitting: Medical

## 2019-07-15 DIAGNOSIS — O0990 Supervision of high risk pregnancy, unspecified, unspecified trimester: Secondary | ICD-10-CM

## 2019-07-15 DIAGNOSIS — O0993 Supervision of high risk pregnancy, unspecified, third trimester: Secondary | ICD-10-CM

## 2019-07-15 DIAGNOSIS — O99820 Streptococcus B carrier state complicating pregnancy: Secondary | ICD-10-CM

## 2019-07-15 DIAGNOSIS — D563 Thalassemia minor: Secondary | ICD-10-CM

## 2019-07-15 DIAGNOSIS — Z3A37 37 weeks gestation of pregnancy: Secondary | ICD-10-CM

## 2019-07-15 DIAGNOSIS — O24419 Gestational diabetes mellitus in pregnancy, unspecified control: Secondary | ICD-10-CM

## 2019-07-15 NOTE — Patient Instructions (Addendum)
Gestational Diabetes Mellitus, Self Care When you have gestational diabetes (gestational diabetes mellitus), you must make sure your blood sugar (glucose) stays in a healthy range. You can do this with:  Nutrition.  Exercise.  Lifestyle changes.  Medicines or insulin, if needed.  Support from your doctors and others. If you get treated for this condition, it may not hurt you or your unborn baby (fetus). If you do not get treated for this condition, it may cause problems that can hurt you or your unborn baby. If you get gestational diabetes, you are:  More likely to get it if you get pregnant again.  More likely to develop type 2 diabetes in the future. How to stay aware of blood sugar   Check your blood sugar every day while you are pregnant. Check it as often as told.  Call your doctor if your blood sugar is above your goal numbers for two tests in a row. Your doctor will set personal treatment goals for you. Generally, you should have these blood sugar levels:  Before meals, or after not eating for a long time (fasting or preprandial): at or below 95 mg/dL (5.3 mmol/L).  After meals (postprandial): ? One hour after a meal: at or below 140 mg/dL (7.8 mmol/L). ? Two hours after a meal: at or below 120 mg/dL (6.7 mmol/L).  A1c (hemoglobin A1c) level: 6-6.5%. How to manage high and low blood sugar Signs of high blood sugar High blood sugar is called hyperglycemia. Know the early signs of high blood sugar. Signs may include:  Feeling: ? Thirsty. ? Hungry. ? Very tired.  Needing to pee (urinate) more than usual.  Blurry vision. Signs of low blood sugar Low blood sugar is called hypoglycemia. This is when blood sugar is at or below 70 mg/dL (3.9 mmol/L). Signs may include:  Feeling: ? Hungry. ? Worried or nervous (anxious). ? Sweaty and clammy. ? Confused. ? Dizzy. ? Sleepy. ? Sick to your stomach (nauseous).  Having: ? A fast heartbeat. ? A headache. ? A change  in your vision. ? Tingling or no feeling (numbness) around your mouth, lips, or tongue. ? Jerky movements that you cannot control (seizure).  Having trouble with: ? Moving (coordination). ? Sleeping. ? Passing out (fainting). ? Getting upset easily (irritability). Treating low blood sugar To treat low blood sugar, eat or drink something sugary right away. If you can think clearly and swallow safely, follow the 15:15 rule:  Take 15 grams of a fast-acting carb (carbohydrate). Talk with your doctor about how much you should take.  Some fast-acting carbs are: ? Sugar tablets (glucose pills). Take 3-4 glucose pills. ? 6-8 pieces of hard candy. ? 4-6 oz (120-150 mL) of fruit juice. ? 4-6 oz (120-150 mL) of regular (not diet) soda. ? 1 Tbsp (15 mL) honey or sugar.  Check your blood sugar 15 minutes after you take the carb.  If your blood sugar is still at or below 70 mg/dL (3.9 mmol/L), take 15 grams of a carb again.  If your blood sugar does not go above 70 mg/dL (3.9 mmol/L) after 3 tries, get help right away.  After your blood sugar goes back to normal, eat a meal or a snack within 1 hour. Treating very low blood sugar If your blood sugar is at or below 54 mg/dL (3 mmol/L), you have very low blood sugar (severe hypoglycemia). This is an emergency. Do not wait to see if the symptoms will go away. Get medical help right  away. Call your local emergency services (911 in the [place] If you have very low blood sugar and you cannot eat or drink, you may need a glucagon shot (injection). A family member or friend should learn how to check your blood sugar and how to give you a glucagon shot. Ask your doctor if you need to have a glucagon shot kit at home. Follow these instructions at home: Medicine  Take your insulin and diabetes medicines as told.  If your doctor says you should take more or less insulin or medicines, do this exactly as told.  Do not run out of insulin or  medicines. Food   Make healthy food choices. These include: ? Chicken, fish, egg whites, and beans. ? Oats, whole wheat, bulgur, brown rice, quinoa, and millet. ? Fresh fruits and vegetables. ? Low-fat dairy products. ? Nuts, avocado, olive oil, and canola oil.  Meet with a food specialist (dietitian). He or she can help you make an eating plan that is right for you.  Follow instructions from your doctor about what you cannot eat or drink.  Drink enough fluid to keep your pee (urine) pale yellow.  Eat healthy snacks between healthy meals.  Keep track of carbs that you eat. Do this by reading food labels and learning food serving sizes.  Follow your sick day plan when you cannot eat or drink normally. Make this plan with your doctor so it is ready to use. Activity  Exercise for 30 or more minutes a day, or as much as your doctor recommends.  Talk with your doctor before you start a new exercise or activity. Your doctor may need to tell you to change: ? How much insulin or medicines you take. ? How much food you eat. Lifestyle  Do not drink alcohol.  Do not use any tobacco products. These include cigarettes, chewing tobacco, and e-cigarettes. If you need help quitting, ask your doctor.  Learn how to deal with stress. If you need help with this, ask your doctor. Body care  Stay up to date with your shots (immunizations).  Brush your teeth and gums two times a day. Floss one or more times a day.  Go to the dentist one or more times every 6 months.  Stay at a healthy weight while you are pregnant. General instructions  Take over-the-counter and prescription medicines only as told by your doctor.  Ask your doctor about risks of high blood pressure in pregnancy (preeclampsia and eclampsia).  Share your diabetes care plan with: ? Your work or school. ? People you live with.  Check your pee for ketones: ? When you are sick. ? As told by your doctor.  Carry a card or  wear jewelry that says you have diabetes.  Keep all follow-up visits as told by your doctor. This is important. Care after giving birth  Have your blood sugar checked 4-12 weeks after you give birth.  Get checked for diabetes one or more times during 3 years. Questions to ask your doctor  Do I need to meet with a diabetes educator?  Where can I find a support group for people with gestational diabetes? Where to find more information To learn more about diabetes, visit:  American Diabetes Association: www.diabetes.org  Centers for Disease Control and Prevention (CDC): www.cdc.gov Summary  Check your blood sugar (glucose) every day while you are pregnant. Check it as often as told.  Take your insulin and diabetes medicines as told.  Keep all follow-up visits as   told by your doctor. This is important.  Have your blood sugar checked 4-12 weeks after you give birth. This information is not intended to replace advice given to you by your health care provider. Make sure you discuss any questions you have with your health care provider. Document Revised: 12/14/2017 Document Reviewed: 07/27/2015 Elsevier Patient Education  2020 Elsevier Inc.  

## 2019-07-15 NOTE — Progress Notes (Signed)
Pt is on the phone preparing for virtual visit with provider. [redacted]w[redacted]d. Pt reports she is not able to check her BP this morning due to being at work. Pt reports her fasting BG level this morning 110.

## 2019-07-15 NOTE — Progress Notes (Signed)
  I connected with Sherri Schmidt on 07/15/19 at  9:45 AM EST by: MyChart and completed on telephone due to issues with connectivity and verified that I am speaking with the correct person using two identifiers.  Patient is located at work and provider is located at [employer].     The purpose of this virtual visit is to provide medical care while limiting exposure to the novel coronavirus. I discussed the limitations, risks, security and privacy concerns of performing an evaluation and management service by MyChart/telephone and the availability of in person appointments. I also discussed with the patient that there may be a patient responsible charge related to this service. By engaging in this virtual visit, you consent to the provision of healthcare.  Additionally, you authorize for your insurance to be billed for the services provided during this visit.  The patient expressed understanding and agreed to proceed.  The following staff members participated in the virtual visit:  Frutoso Chase, RN    PRENATAL VISIT NOTE  Subjective:  Sherri Schmidt is a 29 y.o. G3P1011 at [redacted]w[redacted]d  for phone visit for ongoing prenatal care.  She is currently monitored for the following issues for this high-risk pregnancy and has Supervision of high risk pregnancy, antepartum; Alpha thalassemia silent carrier; GDM, class A2; and GBS (group B Streptococcus carrier), +RV culture, currently pregnant on their problem list.  Patient reports occasional contractions and pelvic pressure.  Contractions: Irritability. Vag. Bleeding: None.  Movement: Present. Denies leaking of fluid.   The following portions of the patient's history were reviewed and updated as appropriate: allergies, current medications, past family history, past medical history, past social history, past surgical history and problem list.   Objective:  There were no vitals filed for this visit. Self-Obtained  Fetal Status:     Movement: Present      Assessment and Plan:  Pregnancy: G3P1011 at [redacted]w[redacted]d 1. GBS (group B Streptococcus carrier), +RV culture, currently pregnant - Treat in labor  2. Supervision of high risk pregnancy, antepartum - Patient is at work without BP cuff. Will take after work today and report if greater than 140/90  3. GDM, class A2 - Taking metformin BID - Reports fasting today 110, had recent issues with lancets and was unable to check CBG as directed  - BPP and NST at MFM 1/12 - IOL scheduled for 39 weeks unless MFM determines need for earlier delivery since patient unable to report on CBGs  4. Alpha thalassemia silent carrier  Term labor symptoms and general obstetric precautions including but not limited to vaginal bleeding, contractions, leaking of fluid and fetal movement were reviewed in detail with the patient.  Return in about 6 days (around 07/21/2019) for [facility], Virtual with MD.  Future Appointments  Date Time Provider Department Center  07/19/2019  2:30 PM [facility] NURSE [facility]  07/19/2019  2:30 PM [facility] [place] 1 [facility] M[facility]-US  07/21/2019 11:15 AM Conan Bowens, MD [facility] None  07/26/2019  2:15 PM [facility] NURSE [facility]  07/26/2019  2:15 PM [facility] [place] 4 [facility] M[facility]-US     Time spent on virtual visit: 10 minutes  Vonzella Nipple, PA-C

## 2019-07-15 NOTE — Telephone Encounter (Signed)
Preadmission screen  

## 2019-07-18 ENCOUNTER — Other Ambulatory Visit: Payer: Self-pay | Admitting: Family Medicine

## 2019-07-19 ENCOUNTER — Other Ambulatory Visit: Payer: Self-pay

## 2019-07-19 ENCOUNTER — Ambulatory Visit (HOSPITAL_COMMUNITY)
Admission: RE | Admit: 2019-07-19 | Discharge: 2019-07-19 | Disposition: A | Discharge status: Home / Self Care | Payer: BC Managed Care – PPO | Source: Ambulatory Visit | Attending: Obstetrics | Admitting: Obstetrics

## 2019-07-19 ENCOUNTER — Encounter (HOSPITAL_COMMUNITY): Payer: Self-pay | Admitting: *Deleted

## 2019-07-19 ENCOUNTER — Ambulatory Visit (HOSPITAL_COMMUNITY): Payer: BC Managed Care – PPO | Admitting: *Deleted

## 2019-07-19 DIAGNOSIS — O99820 Streptococcus B carrier state complicating pregnancy: Secondary | ICD-10-CM | POA: Diagnosis present

## 2019-07-19 DIAGNOSIS — Z3A38 38 weeks gestation of pregnancy: Secondary | ICD-10-CM

## 2019-07-19 DIAGNOSIS — O99013 Anemia complicating pregnancy, third trimester: Secondary | ICD-10-CM

## 2019-07-19 DIAGNOSIS — O24415 Gestational diabetes mellitus in pregnancy, controlled by oral hypoglycemic drugs: Secondary | ICD-10-CM | POA: Diagnosis present

## 2019-07-21 ENCOUNTER — Encounter: Payer: Self-pay | Admitting: Obstetrics and Gynecology

## 2019-07-21 ENCOUNTER — Telehealth (INDEPENDENT_AMBULATORY_CARE_PROVIDER_SITE_OTHER): Payer: BC Managed Care – PPO | Admitting: Obstetrics and Gynecology

## 2019-07-21 ENCOUNTER — Other Ambulatory Visit (HOSPITAL_COMMUNITY): Payer: BC Managed Care – PPO

## 2019-07-21 ENCOUNTER — Other Ambulatory Visit (HOSPITAL_COMMUNITY)
Admission: RE | Admit: 2019-07-21 | Discharge: 2019-07-21 | Disposition: A | Discharge status: Home / Self Care | Payer: BC Managed Care – PPO | Source: Ambulatory Visit | Attending: Family Medicine | Admitting: Family Medicine

## 2019-07-21 ENCOUNTER — Other Ambulatory Visit: Payer: Self-pay

## 2019-07-21 DIAGNOSIS — O99820 Streptococcus B carrier state complicating pregnancy: Secondary | ICD-10-CM

## 2019-07-21 DIAGNOSIS — O0993 Supervision of high risk pregnancy, unspecified, third trimester: Secondary | ICD-10-CM

## 2019-07-21 DIAGNOSIS — O0990 Supervision of high risk pregnancy, unspecified, unspecified trimester: Secondary | ICD-10-CM

## 2019-07-21 DIAGNOSIS — Z20822 Contact with and (suspected) exposure to covid-19: Secondary | ICD-10-CM | POA: Diagnosis not present

## 2019-07-21 DIAGNOSIS — Z01812 Encounter for preprocedural laboratory examination: Secondary | ICD-10-CM | POA: Diagnosis present

## 2019-07-21 DIAGNOSIS — O24419 Gestational diabetes mellitus in pregnancy, unspecified control: Secondary | ICD-10-CM

## 2019-07-21 DIAGNOSIS — Z3A38 38 weeks gestation of pregnancy: Secondary | ICD-10-CM

## 2019-07-21 LAB — SARS CORONAVIRUS 2 (TAT 6-24 HRS): SARS Coronavirus 2: NEGATIVE

## 2019-07-21 NOTE — Progress Notes (Signed)
   PRENATAL VISIT NOTE  Subjective:  Sherri Schmidt is a 29 y.o. G3P1011 at [redacted]w[redacted]d being seen today for ongoing prenatal care.  She is currently monitored for the following issues for this high-risk pregnancy and has Supervision of high risk pregnancy, antepartum; Alpha thalassemia silent carrier; GDM, class A2; and GBS (group B Streptococcus carrier), +RV culture, currently pregnant on their problem list.  Patient reports no complaints.  Contractions: Irregular. Vag. Bleeding: None.  Movement: Present. Denies leaking of fluid.   The following portions of the patient's history were reviewed and updated as appropriate: allergies, current medications, past family history, past medical history, past social history, past surgical history and problem list.   Objective:  There were no vitals filed for this visit.  Fetal Status:     Movement: Present     General:  Alert, oriented and cooperative. Patient is in no acute distress.  Skin: Skin is warm and dry. No rash noted.   Cardiovascular: Normal heart rate noted  Respiratory: Normal respiratory effort, no problems with respiration noted  Abdomen: Soft, gravid, appropriate for gestational age.  Pain/Pressure: Present     Pelvic: Cervical exam deferred        Extremities: Normal range of motion.  Edema: None  Mental Status: Normal mood and affect. Normal behavior. Normal judgment and thought content.   Assessment and Plan:  Pregnancy: G3P1011 at [redacted]w[redacted]d  1. GBS (group B Streptococcus carrier), +RV culture, currently pregnant ppx in labor  2. Supervision of high risk pregnancy, antepartum Reviewed hospital policies regarding visitors, masking, COVID testing, etc.  3. GDM, class A2 metformin FG: 96 this am PP: does not know IOL scheduled for 07/23/19  Term labor symptoms and general obstetric precautions including but not limited to vaginal bleeding, contractions, leaking of fluid and fetal movement were reviewed in detail with the  patient. Please refer to After Visit Summary for other counseling recommendations.   Return in about 5 weeks (around 08/25/2019) for post partum check, virtual.  Future Appointments  Date Time Provider Department Center  07/23/2019  7:00 AM [facility] SCHED ROOM [facility] None  07/26/2019  2:15 PM [facility] NURSE [facility]  07/26/2019  2:15 PM [facility] [place] 4 [facility] M[facility]-US    Conan Bowens, MD

## 2019-07-21 NOTE — Progress Notes (Signed)
Pt not seen.

## 2019-07-21 NOTE — MAU Note (Signed)
Pt denies any Covid symptoms. Tolerated swab with no problems.

## 2019-07-23 ENCOUNTER — Other Ambulatory Visit: Payer: Self-pay

## 2019-07-23 ENCOUNTER — Inpatient Hospital Stay (HOSPITAL_COMMUNITY)
Admission: AD | Admit: 2019-07-23 | Discharge: 2019-07-25 | DRG: 807 | Disposition: A | Discharge status: Home / Self Care | Payer: BC Managed Care – PPO | Attending: Obstetrics and Gynecology | Admitting: Obstetrics and Gynecology

## 2019-07-23 ENCOUNTER — Inpatient Hospital Stay (HOSPITAL_COMMUNITY): Payer: BC Managed Care – PPO

## 2019-07-23 ENCOUNTER — Inpatient Hospital Stay (HOSPITAL_COMMUNITY): Payer: BC Managed Care – PPO | Admitting: Anesthesiology

## 2019-07-23 ENCOUNTER — Encounter (HOSPITAL_COMMUNITY): Payer: Self-pay | Admitting: Obstetrics and Gynecology

## 2019-07-23 DIAGNOSIS — D563 Thalassemia minor: Secondary | ICD-10-CM | POA: Diagnosis present

## 2019-07-23 DIAGNOSIS — O24425 Gestational diabetes mellitus in childbirth, controlled by oral hypoglycemic drugs: Secondary | ICD-10-CM | POA: Diagnosis present

## 2019-07-23 DIAGNOSIS — O99824 Streptococcus B carrier state complicating childbirth: Secondary | ICD-10-CM | POA: Diagnosis present

## 2019-07-23 DIAGNOSIS — Z3A39 39 weeks gestation of pregnancy: Secondary | ICD-10-CM

## 2019-07-23 DIAGNOSIS — O99820 Streptococcus B carrier state complicating pregnancy: Secondary | ICD-10-CM

## 2019-07-23 DIAGNOSIS — Z2839 Other underimmunization status: Secondary | ICD-10-CM

## 2019-07-23 DIAGNOSIS — O09899 Supervision of other high risk pregnancies, unspecified trimester: Secondary | ICD-10-CM

## 2019-07-23 DIAGNOSIS — O24424 Gestational diabetes mellitus in childbirth, insulin controlled: Secondary | ICD-10-CM | POA: Diagnosis not present

## 2019-07-23 DIAGNOSIS — O24419 Gestational diabetes mellitus in pregnancy, unspecified control: Secondary | ICD-10-CM

## 2019-07-23 DIAGNOSIS — O0990 Supervision of high risk pregnancy, unspecified, unspecified trimester: Secondary | ICD-10-CM

## 2019-07-23 DIAGNOSIS — Z283 Underimmunization status: Secondary | ICD-10-CM

## 2019-07-23 LAB — GLUCOSE, CAPILLARY
Glucose-Capillary: 83 mg/dL (ref 70–99)
Glucose-Capillary: 127 mg/dL — ABNORMAL HIGH (ref 70–99)
Glucose-Capillary: 82 mg/dL (ref 70–99)
Glucose-Capillary: 78 mg/dL (ref 70–99)
Glucose-Capillary: 96 mg/dL (ref 70–99)

## 2019-07-23 LAB — CBC
HCT: 34.6 % — ABNORMAL LOW (ref 36.0–46.0)
Hemoglobin: 11.4 g/dL — ABNORMAL LOW (ref 12.0–15.0)
MCH: 29.8 pg (ref 26.0–34.0)
MCHC: 32.9 g/dL (ref 30.0–36.0)
MCV: 90.6 fL (ref 80.0–100.0)
Platelets: 140 10*3/uL — ABNORMAL LOW (ref 150–400)
RBC: 3.82 MIL/uL — ABNORMAL LOW (ref 3.87–5.11)
RDW: 13.3 % (ref 11.5–15.5)
WBC: 6.8 10*3/uL (ref 4.0–10.5)
nRBC: 0 % (ref 0.0–0.2)

## 2019-07-23 LAB — TYPE AND SCREEN
ABO/RH(D): A POS
Antibody Screen: NEGATIVE

## 2019-07-23 LAB — RPR: RPR Ser Ql: NONREACTIVE

## 2019-07-23 MED ORDER — PHENYLEPHRINE 40 MCG/ML (10ML) SYRINGE FOR IV PUSH (FOR BLOOD PRESSURE SUPPORT): 80.0000 ug | PREFILLED_SYRINGE | INTRAVENOUS | Status: DC | PRN

## 2019-07-23 MED ORDER — LACTATED RINGERS IV SOLN: 500.0000 mL | INTRAVENOUS | Status: DC | PRN

## 2019-07-23 MED ORDER — EPHEDRINE 5 MG/ML INJ: 10.0000 mg | INTRAVENOUS | Status: DC | PRN

## 2019-07-23 MED ORDER — TERBUTALINE SULFATE 1 MG/ML IJ SOLN: 0.2500 mg | Freq: Once | INTRAMUSCULAR | Status: DC | PRN

## 2019-07-23 MED ORDER — FENTANYL-BUPIVACAINE-NACL 0.5-0.125-0.9 MG/250ML-% EP SOLN
12.0000 mL/h | EPIDURAL | Status: DC | PRN
  Filled 2019-07-23: qty 250

## 2019-07-23 MED ORDER — SODIUM CHLORIDE 0.9 % IV SOLN
5.0000 10*6.[IU] | Freq: Once | INTRAVENOUS | Status: AC
  Administered 2019-07-23: 5 10*6.[IU] via INTRAVENOUS
  Filled 2019-07-23: qty 5

## 2019-07-23 MED ORDER — PENICILLIN G POT IN DEXTROSE 60000 UNIT/ML IV SOLN
3.0000 10*6.[IU] | INTRAVENOUS | Status: DC
  Administered 2019-07-23 – 2019-07-24 (×3): 3 10*6.[IU] via INTRAVENOUS
  Filled 2019-07-23 (×3): qty 50

## 2019-07-23 MED ORDER — ONDANSETRON HCL 4 MG/2ML IJ SOLN: 4.0000 mg | Freq: Four times a day (QID) | INTRAMUSCULAR | Status: DC | PRN

## 2019-07-23 MED ORDER — OXYTOCIN 40 UNITS IN NORMAL SALINE INFUSION - SIMPLE MED
1.0000 m[IU]/min | INTRAVENOUS | Status: DC
  Administered 2019-07-23: 16:00:00 2 m[IU]/min via INTRAVENOUS
  Filled 2019-07-23: qty 1000

## 2019-07-23 MED ORDER — OXYCODONE-ACETAMINOPHEN 5-325 MG PO TABS: 1.0000 | ORAL_TABLET | ORAL | Status: DC | PRN

## 2019-07-23 MED ORDER — ACETAMINOPHEN 325 MG PO TABS: 650.0000 mg | ORAL_TABLET | ORAL | Status: DC | PRN

## 2019-07-23 MED ORDER — MISOPROSTOL 25 MCG QUARTER TABLET
25.0000 ug | ORAL_TABLET | ORAL | Status: DC | PRN
  Administered 2019-07-23: 09:00:00 25 ug via VAGINAL
  Filled 2019-07-23: qty 1

## 2019-07-23 MED ORDER — LACTATED RINGERS IV SOLN
INTRAVENOUS | Status: DC
  Administered 2019-07-23: 125 mL/h via INTRAVENOUS

## 2019-07-23 MED ORDER — OXYTOCIN BOLUS FROM INFUSION
500.0000 mL | Freq: Once | INTRAVENOUS | Status: AC
  Administered 2019-07-24: 500 mL via INTRAVENOUS

## 2019-07-23 MED ORDER — DIPHENHYDRAMINE HCL 50 MG/ML IJ SOLN: 12.5000 mg | INTRAMUSCULAR | Status: DC | PRN

## 2019-07-23 MED ORDER — OXYTOCIN 40 UNITS IN NORMAL SALINE INFUSION - SIMPLE MED: 2.5000 [IU]/h | INTRAVENOUS | Status: DC

## 2019-07-23 MED ORDER — LACTATED RINGERS AMNIOINFUSION: INTRAVENOUS | Status: DC

## 2019-07-23 MED ORDER — FENTANYL CITRATE (PF) 100 MCG/2ML IJ SOLN
100.0000 ug | INTRAMUSCULAR | Status: DC | PRN
  Administered 2019-07-23: 100 ug via INTRAVENOUS
  Filled 2019-07-23: qty 2

## 2019-07-23 MED ORDER — OXYCODONE-ACETAMINOPHEN 5-325 MG PO TABS: 2.0000 | ORAL_TABLET | ORAL | Status: DC | PRN

## 2019-07-23 MED ORDER — LIDOCAINE HCL (PF) 1 % IJ SOLN
INTRAMUSCULAR | Status: DC | PRN
  Administered 2019-07-23: 10 mL via EPIDURAL

## 2019-07-23 MED ORDER — SODIUM CHLORIDE (PF) 0.9 % IJ SOLN
INTRAMUSCULAR | Status: DC | PRN
  Administered 2019-07-23: 12 mL/h via EPIDURAL

## 2019-07-23 MED ORDER — LACTATED RINGERS IV SOLN
500.0000 mL | Freq: Once | INTRAVENOUS | Status: AC
  Administered 2019-07-23: 500 mL via INTRAVENOUS

## 2019-07-23 MED ORDER — LIDOCAINE HCL (PF) 1 % IJ SOLN: 30.0000 mL | INTRAMUSCULAR | Status: DC | PRN

## 2019-07-23 MED ORDER — SOD CITRATE-CITRIC ACID 500-334 MG/5ML PO SOLN: 30.0000 mL | ORAL | Status: DC | PRN

## 2019-07-23 NOTE — Progress Notes (Signed)
Labor Progress Note Sherri Schmidt is a 29 y.o. G3P1011 at [redacted]w[redacted]d presented for IOL d/t GDMA2 S: Patient received epidural, pain control adequate.  O:  BP (!) 118/47   Pulse 100   Temp 98.2 F (36.8 C) (Oral)   Resp 18   Ht 5\' 1"  (1.549 m)   Wt 81.7 kg   LMP 10/23/2018   SpO2 100%   BMI 34.05 kg/m  EFM: 130 bpm/+accels/+variable decels  CVE: Dilation: 8 Effacement (%): 90 Cervical Position: Posterior Station: 0 Presentation: Vertex Exam by:: Sherri Slater MD   A&P: 29 y.o. G3P1011 [redacted]w[redacted]d here for IOL d/t GDMA2 #Labor: AROM for clear. Patient CVE changed quickly from 6/80/-1 to 8/90/0 and having several variables after AROM. Considering amnioinfusion, however, changed sides from L to R and fetus tolerating contractions now. #Pain: Epidural #FWB: Cat II #GBS positive, PCN #GDM- most recent CBG 78  [redacted]w[redacted]d, MD 11:28 PM

## 2019-07-23 NOTE — Progress Notes (Signed)
Labor Progress Note Sherri Schmidt is a 29 y.o. G3P1011 at [redacted]w[redacted]d presented for IOL d/t GDMA2 S: Patient is tolerating contractions well without analgesia  O:  BP 114/80   Pulse 79   Temp 99 F (37.2 C) (Oral)   Resp 18   Ht 5\' 1"  (1.549 m)   Wt 81.7 kg   LMP 10/23/2018   BMI 34.05 kg/m  EFM: 145 bpm/+accels/-decels  CVE: Dilation: 5 Effacement (%): 50 Cervical Position: Posterior Station: -2 Presentation: Vertex Exam by:: Naviyah Schaffert MD   A&P: 29 y.o. G3P1011 [redacted]w[redacted]d here for IOL d/t GDMA2 #Labor: Currently on pit @10  mu/min, CVE unchanged, continue to increase pit q97min. Plan to recheck around 11 PM, possibly AROM then. #Pain: Per patient request, wishes to labor without analgesia as long as she can tolerate it #FWB: Cat I #GBS positive, PCN #GDM- most recent CBG 78  , MD 8:36 PM

## 2019-07-23 NOTE — Progress Notes (Signed)
Sherri Schmidt is a 29 y.o. G3P1011 at [redacted]w[redacted]d admitted for induction of labor due to [facility]  Subjective: Patient is doing well and managing pain without any request for IV meds/epidural at this time.  Objective: BP 109/63   Pulse 84   Temp 99 F (37.2 C) (Oral)   Resp 16   Ht 5\' 1"  (1.549 m)   Wt 81.7 kg   LMP 10/23/2018   BMI 34.05 kg/m  No intake/output data recorded.  FHT:  FHR: 150 bpm, variability: moderate,  accelerations:  Present,  decelerations:  Absent UC:   regular, every 2-3 minutes  SVE:   Dilation: 5 Effacement (%): 70, 80 Station: -2 Exam by:: [email or web address]  Labs: Lab Results  Component Value Date   WBC 6.8 07/23/2019   HGB 11.4 (L) 07/23/2019   HCT 34.6 (L) 07/23/2019   MCV 90.6 07/23/2019   PLT 140 (L) 07/23/2019    Assessment / Plan: 07/25/2019. [name] is a 29 yo G3P1011 at [redacted]w[redacted]d admitted for induction of labor due to [facility].  Labor: Progressing well, s/p cytotec x1, FB, consider pit if contractions space out  GDMA2: nl fasting glucose this AM, q4 CBGs, continue holding Metformin, carb controlled diet   Fetal Wellbeing:  Category I   Pain Control:  Labor support without medications, IV pain meds/epidural per patient request  I/D:  GBS+, PCN   Anticipated MOD:  Vaginal Delivery   [facility], MS3   07/23/2019, 1:26 PM

## 2019-07-23 NOTE — Anesthesia Procedure Notes (Signed)
Epidural Patient location during procedure: OB Start time: 07/23/2019 10:05 PM End time: 07/23/2019 10:16 PM  Staffing Anesthesiologist: Lucretia Kern, MD Performed: anesthesiologist   Preanesthetic Checklist Completed: patient identified, IV checked, risks and benefits discussed, monitors and equipment checked, pre-op evaluation and timeout performed  Epidural Patient position: sitting Prep: DuraPrep Patient monitoring: heart rate, continuous pulse ox and blood pressure Approach: midline Location: L3-L4 Injection technique: LOR air  Needle:  Needle type: Tuohy  Needle gauge: 17 G Needle length: 9 cm Needle insertion depth: 6 cm Catheter type: closed end flexible Catheter size: 19 Gauge Catheter at skin depth: 11 cm Test dose: negative  Assessment Events: blood not aspirated, injection not painful, no injection resistance, no paresthesia and negative IV test  Additional Notes Reason for block:procedure for pain

## 2019-07-23 NOTE — Progress Notes (Addendum)
Sherri Schmidt is a 29 y.o. G3P1011 at [redacted]w[redacted]d admitted for induction of labor due to [facility]  Subjective: Patient is doing well and managing pain without any request for IV meds/epidural at this time.  Objective: BP (!) 105/48 (BP Location: Left Arm)   Pulse 86   Temp 99 F (37.2 C) (Oral)   Resp 16   Ht 5\' 1"  (1.549 m)   Wt 81.7 kg   LMP 10/23/2018   BMI 34.05 kg/m  No intake/output data recorded.  FHT:  FHR: 150 bpm, variability: moderate,  accelerations:  Present,  decelerations:  Absent UC:   regular, every 1-4 minutes  SVE:   Dilation: 5 Effacement (%): 70, 80 Station: -2 Exam by:: [email or web address]  Labs: Lab Results  Component Value Date   WBC 6.8 07/23/2019   HGB 11.4 (L) 07/23/2019   HCT 34.6 (L) 07/23/2019   MCV 90.6 07/23/2019   PLT 140 (L) 07/23/2019   CBG: @0921  - 83 mg/dL  @1412  - 127 mg/dL   Assessment / Plan: Sherri Schmidt is a 29 yo G3P1011 at [redacted]w[redacted]d admitted for induction of labor due to [facility].  Labor: Progressing well, s/p cytotec x1, FB, start pitocin given spacing out of contractions  GDMA2: recent elevated BG (127) following popsicle consumption, given labor status , begin q2 CBGs, continue holding Metformin, diet changed from carb modified to clear liquid  Fetal Wellbeing:  Category I   Pain Control:  Labor support without medications, IV pain meds/epidural per patient request  I/D:  GBS+, PCN   Anticipated MOD:  Vaginal Delivery   30, MS3   07/23/2019, 3:38 PM  I confirm that I have verified the information documented in the medical student's note and that I have also personally coordinated and supervised all medical decision making activities of this service and have verified that all service and findings are accurately documented in this student's note.   [facility], CNM 07/23/2019 4:13 PM

## 2019-07-23 NOTE — H&P (Addendum)
OBSTETRIC ADMISSION HISTORY AND PHYSICAL  Sherri Schmidt is a 29 y.o. female G3P1011 with IUP at 43w0dby US_0  presenting for IOL 2/2 GDMA2. She reports +FMs, No LOF, no VB, no blurry vision, headaches or peripheral edema, and RUQ pain.  She plans on bottle feeding. She request Depo-provera for birth control. [facility]  Dating: By U/S _1  --->  Estimated Date of Delivery: 07/30/19  Sono:    _2 , CWD  _3 , CWD, normal anatomy, transverse presentation (head to maternal right), posterior placental lie, 264g, 49% EFW _4 , CWD, normal anatomy, breech presentation, posterior placental lie, 529g, 36% _5 , CWD, normal anatomy, cephalic presentation, posterior placental lie, 2333g, 48% _6 , CWD, normal anatomy, cephalic presentation (BPP) _7 , CWD, normal anatomy, cephalic presentation (BPP) _8 , CWD, normal anatomy, cephalic presenatation (BPP)   Prenatal History/Complications: GDMA2; treated with metformin 10034mBID , followed by MFM w/ nl growth scans  GBS+ ; +RV culture  Alpha thal silent carrier    Past Medical History: Past Medical History:  Diagnosis Date   Gestational diabetes     Past Surgical History: Past Surgical History:  Procedure Laterality Date   NO PAST SURGERIES      Obstetrical History: OB History     Gravida  3   Para  1   Term  1   Preterm      AB  1   Living  1      SAB      TAB  1   Ectopic      Multiple      Live Births  1           Social History Social History   Socioeconomic History   Marital status: Single    Spouse name: Not on file   Number of children: Not on file   Years of education: Not on file   Highest education level: Not on file  Occupational History   Not on file  Tobacco Use   Smoking status: Never Smoker   Smokeless tobacco: Never Used  Substance and Sexual Activity   Alcohol use: No   Drug use: No   Sexual activity: Yes  Other Topics Concern    Not on file  Social History Narrative   Not on file   Social Determinants of Health   Financial Resource Strain:    Difficulty of Paying Living Expenses: Not on file  Food Insecurity:    Worried About Running Out of Food in the Last Year: Not on file   Ran Out of Food in the Last Year: Not on file  Transportation Needs:    Lack of Transportation (Medical): Not on file   Lack of Transportation (Non-Medical): Not on file  Physical Activity:    Days of Exercise per Week: Not on file   Minutes of Exercise per Session: Not on file  Stress:    Feeling of Stress : Not on file  Social Connections:    Frequency of Communication with Friends and Family: Not on file   Frequency of Social Gatherings with Friends and Family: Not on file   Attends Religious Services: Not on file   Active Member of Clubs or Organizations: Not on file   Attends ClArchivisteetings: Not on file   Marital Status: Not on file    Family History: Family History  Problem Relation Age of Onset   Hypertension Mother    Diabetes Mother     Allergies:  Allergies  Allergen Reactions   Other Rash    Chlorox products--rash    Medications Prior to Admission  Medication Sig Dispense Refill Last Dose   Accu-Chek FastClix Lancets MISC 1 Units by Percutaneous route 4 (four) times daily. Check BS AM fasting and 2 hours after each meal. 100 each 12    Blood Glucose Monitoring Suppl (ACCU-CHEK GUIDE) w/Device KIT 1 Device by Does not apply route 4 (four) times daily. 1 kit 0    Blood Pressure Monitoring KIT 1 kit by Does not apply route once a week. 1 kit 0    ferrous sulfate 325 (65 FE) MG tablet Take 1 tablet (325 mg total) by mouth daily with breakfast. 30 tablet 2    glucose blood (ACCU-CHEK GUIDE) test strip Use to check blood sugars four times a day was instructed 50 each 12    metFORMIN (GLUCOPHAGE XR) 500 MG 24 hr tablet Take 2 tablets (1,000 mg total) by mouth 2 (two) times daily after a meal. Take  after breakfast and after supper. 60 tablet 11    Misc. Devices MISC Dispense one maternity belt for patient 1 each 0    Prenatal Vit-Fe Fumarate-FA (PRENATAL MULTIVITAMIN) TABS tablet Take 1 tablet by mouth daily at 12 noon.       Review of Systems   All systems reviewed and negative except as stated in HPI  Blood pressure 116/67, pulse 93, temperature 98.1 F (36.7 C), temperature source Oral, resp. rate 16, height _0  (1.549 m), weight 81.7 kg, last menstrual period 10/23/2018. General appearance: alert, cooperative and no distress Heart: regular rate and rhythm Abdomen: soft, non-tender; bowel sounds normal Extremities: Homans sign is negative, no sign of DVT Presentation: cephalic Fetal monitoringBaseline: 150 bpm, Variability: Good {> 6 bpm), Accelerations: Reactive and Decelerations: Absent Uterine activity: No contractions   Prenatal labs: ABO, Rh: --/--/PENDING (01/16 0815) Antibody: PENDING (01/16 0815)NA Rubella: <20.0 (07/06 1058) RPR: Non Reactive (11/05 0945)  HBsAg: Negative (07/06 1058)  HIV: Non Reactive (11/05 0945)  GBS: Positive/-- (12/31 1109)  2 hr GTT : failed - BG_1  163, BG_2  157 Genetic screening: low risk (panorama)    Prenatal Transfer Tool  Maternal Diabetes: Yes:  Diabetes Type:  Insulin/Medication controlled Genetic Screening: Normal Maternal Ultrasounds/Referrals: Normal Fetal Ultrasounds or other Referrals:  Referred to Materal Fetal Medicine  Maternal Substance Abuse:  No Significant Maternal Medications:  Meds include: Other: Metformin Significant Maternal Lab Results: Group B Strep positive  Results for orders placed or performed during the hospital encounter of 07/23/19 (from the past 24 hour(s))  CBC   Collection Time: 07/23/19  8:15 AM  Result Value Ref Range   WBC 6.8 4.0 - 10.5 K/uL   RBC 3.82 (L) 3.87 - 5.11 MIL/uL   Hemoglobin 11.4 (L) 12.0 - 15.0 g/dL   HCT 34.6 (L) 36.0 - 46.0 %   MCV 90.6 80.0 - 100.0 fL   MCH 29.8 26.0  - 34.0 pg   MCHC 32.9 30.0 - 36.0 g/dL   RDW 13.3 11.5 - 15.5 %   Platelets 140 (L) 150 - 400 K/uL   nRBC 0.0 0.0 - 0.2 %  Type and screen   Collection Time: 07/23/19  8:15 AM  Result Value Ref Range   ABO/RH(D) PENDING    Antibody Screen PENDING    Sample Expiration      07/26/2019,2359 Performed at [facility], [place] [address] [place], [place] [address]     Patient Active Problem List  Diagnosis Date Noted   GBS (group B Streptococcus carrier), +RV culture, currently pregnant 07/11/2019   GDM, class A2 06/22/2019   Alpha thalassemia silent carrier 02/14/2019   Supervision of high risk pregnancy, antepartum 01/10/2019    Assessment/Plan:  Sherri Schmidt is a 29 y.o. G3P1011 at 11w0dhere for IOL 2/2 GDMA2.   #Labor: SVE to assess cervical dilation and effacement,  IOL w/ cytotec x1 and FB   #GDMA2: Monitor BG levels w/ q4 CBG, hold Metformin but restart if indicated, carb controlled diet  #Pain: Epidural and IV pain meds per patient request    #FWB: Category I  #ID:  GBS+, start penicillin  #MOF: Bottle #MOC: Depo-provera #Circ:  [facility]  07/23/2019, 9:17 AM  I confirm that I have verified the information documented in the medical student's note and that I have also personally reperformed the history, physical exam and all medical decision making activities of this service and have verified that all service and findings are accurately documented in this student's note.   SMallie Snooks CNM 07/23/2019 10:40 AM

## 2019-07-23 NOTE — Plan of Care (Signed)
  Problem: Education: Goal: Knowledge of General Education information will improve Description: Including pain rating scale, medication(s)/side effects and non-pharmacologic comfort measures Outcome: Progressing   Problem: Pain Management: Goal: Relief or control of pain from uterine contractions will improve Outcome: Progressing   

## 2019-07-23 NOTE — Progress Notes (Addendum)
Sherri Schmidt is a 29 y.o. G3P1011 at [redacted]w[redacted]d admitted for induction of labor due to GDMA2  Subjective: Patient laying on her side in bed, pain still manageable around 1-2 out of 10.   Objective: BP (!) 101/59   Pulse 73   Temp 99 F (37.2 C) (Oral)   Resp 18   Ht 5\' 1"  (1.549 m)   Wt 81.7 kg   LMP 10/23/2018   BMI 34.05 kg/m  No intake/output data recorded.  FHT:  FHR: 160 bpm, variability: moderate,  accelerations:  Present,  decelerations:  Absent UC:   irregular, every 1-4 minutes  SVE:   Dilation: 5 Effacement (%): 70 Station: -2 Exam by:: [email or web address] CNM student  Pitocin @ 6 mu/min  Labs: Lab Results  Component Value Date   WBC 6.8 07/23/2019   HGB 11.4 (L) 07/23/2019   HCT 34.6 (L) 07/23/2019   MCV 90.6 07/23/2019   PLT 140 (L) 07/23/2019    Assessment / Plan: 07/25/2019. Sherri Schmidt is a 29 yo G3P1011 at [redacted]w[redacted]d admitted for induction of labor due to [facility].  Labor: s/p cytotec x1, FB, pit started at ~1540, currently @6  mu/min, cervical exam unchanged, increase pit q44min,  AROM as indicated   GDMA2: nl BG, continue q2 CBGs, continue holding Metformin, diet changed from carb modified to clear liquid  Fetal Wellbeing:  Category I   Pain Control:  Labor support without medications, IV pain meds/epidural per patient request  I/D:  GBS+, PCN   Anticipated MOD:  Vaginal Delivery   , MS3  07/23/2019, 6:18 PM

## 2019-07-23 NOTE — Progress Notes (Signed)
Labor Progress Note Sherri Schmidt is a 29 y.o. G3P1011 at [redacted]w[redacted]d presented for IOL d/t GDMA2 S: Patient reports feeling need to defecate, likely fetal station descending.  O:  BP (!) 116/55   Pulse 85   Temp 98.2 F (36.8 C) (Oral)   Resp 18   Ht 5\' 1"  (1.549 m)   Wt 81.7 kg   LMP 10/23/2018   SpO2 100%   BMI 34.05 kg/m  EFM: 130 bpm/+accels/+variable decels  CVE: Dilation: 8 Effacement (%): 90 Cervical Position: Posterior Station: 0 Presentation: Vertex Exam by:: Fair MD   A&P: 29 y.o. G3P1011 [redacted]w[redacted]d here for IOL d/t GDMA2 #Labor: Variables continued despite changing side lying, IUPC placed, amnioinfusion 250cc bolus, 125cc maintenance.  #Pain: Epidural #FWB: Cat II #GBS positive, PCN #GDM- most recent CBG 96  [redacted]w[redacted]d, MD 11:57 PM

## 2019-07-23 NOTE — Anesthesia Preprocedure Evaluation (Signed)
Anesthesia Evaluation  Patient identified by MRN, date of birth, ID band Patient awake    Reviewed: Allergy & Precautions, H&P , NPO status , Patient's Chart, lab work & pertinent test results  History of Anesthesia Complications Negative for: history of anesthetic complications  Airway Mallampati: II  TM Distance: >3 FB Neck ROM: full    Dental no notable dental hx.    Pulmonary neg pulmonary ROS,    Pulmonary exam normal        Cardiovascular negative cardio ROS Normal cardiovascular exam Rhythm:regular Rate:Normal     Neuro/Psych negative neurological ROS  negative psych ROS   GI/Hepatic negative GI ROS, Neg liver ROS,   Endo/Other  diabetes, Gestational  Renal/GU negative Renal ROS  negative genitourinary   Musculoskeletal   Abdominal   Peds  Hematology negative hematology ROS (+)   Anesthesia Other Findings   Reproductive/Obstetrics (+) Pregnancy                             Anesthesia Physical Anesthesia Plan  ASA: II  Anesthesia Plan: Epidural   Post-op Pain Management:    Induction:   PONV Risk Score and Plan:   Airway Management Planned:   Additional Equipment:   Intra-op Plan:   Post-operative Plan:   Informed Consent: I have reviewed the patients History and Physical, chart, labs and discussed the procedure including the risks, benefits and alternatives for the proposed anesthesia with the patient or authorized representative who has indicated his/her understanding and acceptance.       Plan Discussed with:   Anesthesia Plan Comments:         Anesthesia Quick Evaluation  

## 2019-07-24 ENCOUNTER — Encounter (HOSPITAL_COMMUNITY): Payer: Self-pay | Admitting: Obstetrics and Gynecology

## 2019-07-24 DIAGNOSIS — O24424 Gestational diabetes mellitus in childbirth, insulin controlled: Secondary | ICD-10-CM

## 2019-07-24 DIAGNOSIS — O99824 Streptococcus B carrier state complicating childbirth: Secondary | ICD-10-CM

## 2019-07-24 DIAGNOSIS — Z3A39 39 weeks gestation of pregnancy: Secondary | ICD-10-CM

## 2019-07-24 LAB — GLUCOSE, RANDOM: Glucose, Bld: 153 mg/dL — ABNORMAL HIGH (ref 70–99)

## 2019-07-24 LAB — GLUCOSE, CAPILLARY
Glucose-Capillary: 95 mg/dL (ref 70–99)
Glucose-Capillary: 99 mg/dL (ref 70–99)

## 2019-07-24 MED ORDER — IBUPROFEN 600 MG PO TABS
600.0000 mg | ORAL_TABLET | Freq: Four times a day (QID) | ORAL | Status: DC
  Administered 2019-07-24 – 2019-07-25 (×7): 600 mg via ORAL
  Filled 2019-07-24 (×6): qty 1

## 2019-07-24 MED ORDER — SIMETHICONE 80 MG PO CHEW: 80.0000 mg | CHEWABLE_TABLET | ORAL | Status: DC | PRN

## 2019-07-24 MED ORDER — PRENATAL MULTIVITAMIN CH
1.0000 | ORAL_TABLET | Freq: Every day | ORAL | Status: DC
  Administered 2019-07-24 – 2019-07-25 (×2): 1 via ORAL
  Filled 2019-07-24 (×2): qty 1

## 2019-07-24 MED ORDER — DIBUCAINE (PERIANAL) 1 % EX OINT: 1.0000 "application " | TOPICAL_OINTMENT | CUTANEOUS | Status: DC | PRN

## 2019-07-24 MED ORDER — SENNOSIDES-DOCUSATE SODIUM 8.6-50 MG PO TABS
2.0000 | ORAL_TABLET | ORAL | Status: DC
  Administered 2019-07-24: 2 via ORAL
  Filled 2019-07-24: qty 2

## 2019-07-24 MED ORDER — BENZOCAINE-MENTHOL 20-0.5 % EX AERO: 1.0000 "application " | INHALATION_SPRAY | CUTANEOUS | Status: DC | PRN

## 2019-07-24 MED ORDER — COCONUT OIL OIL: 1.0000 "application " | TOPICAL_OIL | Status: DC | PRN

## 2019-07-24 MED ORDER — ZOLPIDEM TARTRATE 5 MG PO TABS: 5.0000 mg | ORAL_TABLET | Freq: Every evening | ORAL | Status: DC | PRN

## 2019-07-24 MED ORDER — ONDANSETRON HCL 4 MG PO TABS: 4.0000 mg | ORAL_TABLET | ORAL | Status: DC | PRN

## 2019-07-24 MED ORDER — ACETAMINOPHEN 325 MG PO TABS: 650.0000 mg | ORAL_TABLET | ORAL | Status: DC | PRN

## 2019-07-24 MED ORDER — WITCH HAZEL-GLYCERIN EX PADS: 1.0000 "application " | MEDICATED_PAD | CUTANEOUS | Status: DC | PRN

## 2019-07-24 MED ORDER — ONDANSETRON HCL 4 MG/2ML IJ SOLN: 4.0000 mg | INTRAMUSCULAR | Status: DC | PRN

## 2019-07-24 MED ORDER — DIPHENHYDRAMINE HCL 25 MG PO CAPS: 25.0000 mg | ORAL_CAPSULE | Freq: Four times a day (QID) | ORAL | Status: DC | PRN

## 2019-07-24 MED ORDER — TETANUS-DIPHTH-ACELL PERTUSSIS 5-2.5-18.5 LF-MCG/0.5 IM SUSP: 0.5000 mL | Freq: Once | INTRAMUSCULAR | Status: DC

## 2019-07-24 MED ORDER — MEDROXYPROGESTERONE ACETATE 150 MG/ML IM SUSP
150.0000 mg | Freq: Once | INTRAMUSCULAR | Status: AC
  Administered 2019-07-25: 150 mg via INTRAMUSCULAR
  Filled 2019-07-24: qty 1

## 2019-07-24 NOTE — Lactation Note (Signed)
This note was copied from a baby's chart. Lactation Consultation Note  Patient Name: Boy Aalina Brege WGNFA'O Date: 07/24/2019  P2, 3 hour female infant. Mom's feeding choice at admission was breast and formula feeding. Per RN, mom's current feeding choice is formula only.    Maternal Data Formula Feeding for Exclusion: Yes Reason for exclusion: Mother's choice to formula feed on admision  Feeding Feeding Type: Bottle Fed - Formula  LATCH Score                   Interventions    Lactation Tools Discussed/Used     Consult Status      Danelle Earthly 07/24/2019, 6:16 AM

## 2019-07-24 NOTE — Discharge Summary (Signed)
Postpartum Discharge Summary    Patient Name: Sherri Schmidt DOB: May 20, 1991 MRN: [id number]  Date of admission: 07/23/2019 Delivering Provider: Gladys Damme   Date of discharge: 07/25/2019  Admitting diagnosis: GDM, class A2 [O24.419] Intrauterine pregnancy: [redacted]w[redacted]d    Secondary diagnosis:  Active Problems:   Supervision of high risk pregnancy, antepartum   Alpha thalassemia silent carrier   GDM, class A2   GBS (group B Streptococcus carrier), +RV culture, currently pregnant   Rubella non-immune status, antepartum  Additional problems: None     Discharge diagnosis: Term Pregnancy Delivered and GDM A2                                                                                                Post partum procedures: None  Augmentation: AROM, Pitocin, Cytotec and Foley Balloon  Complications: None  Hospital course:  Induction of Labor With Vaginal Delivery   29y.o. yo G3P1011 at 336w1das admitted to the hospital 07/23/2019 for induction of labor.  Indication for induction: A2 DM.  Patient had an uncomplicated labor course as follows: Initial SVE 1/60/-3. Patient received Cytotec, Foley bulb, Pitocin and AROM. Received epidural and progressed to complete with uncomplicated delivery. Membrane Rupture Time/Date: 10:42 PM ,07/23/2019   Intrapartum Procedures: Episiotomy: None [1]                                         Lacerations:  None [1]  Patient had delivery of a Viable infant.  Information for the patient's newborn:  ChChestina, [place]0[[id number]]    07/24/2019  Details of delivery can be found in separate delivery note. Ordered for Depo post-partum. Fasting AM glucose abnormal at 153. Patient had a routine postpartum course. She declined MMR vaccination after discussion of risks/benefits and review of consequences of rubella infection during possible future pregnancy. Patient is discharged home 07/25/19. Delivery time: 2:54 AM    Magnesium Sulfate received:  No BMZ received: No Rhophylac:No MMR:No-patient declined Transfusion:No  Physical exam  Vitals:   07/24/19 1415 07/24/19 1713 07/24/19 2021 07/25/19 0547  BP: 115/64 124/85 118/75 118/73  Pulse: 62 68 70 62  Resp: _0 Temp: 97.8 F (36.6 C) 97.8 F (36.6 C) 98.8 F (37.1 C) (!) 97.5 F (36.4 C)  TempSrc: Oral Oral Oral Oral  SpO2: 100%  100%   Weight:      Height:       General: alert, cooperative and no distress Lochia: appropriate Uterine Fundus: firm Incision: N/A DVT Evaluation: No evidence of DVT seen on physical exam. No significant calf/ankle edema. Labs: Lab Results  Component Value Date   WBC 6.8 07/23/2019   HGB 11.4 (L) 07/23/2019   HCT 34.6 (L) 07/23/2019   MCV 90.6 07/23/2019   PLT 140 (L) 07/23/2019   CMP Latest Ref Rng & Units 07/24/2019  Glucose 70 - 99 mg/dL 153(H)    Discharge instruction: per After Visit Summary and "Baby and Me Booklet".  After visit meds:  Allergies as of 07/25/2019      Reactions   Other Rash   Chlorox products--rash      Medication List    STOP taking these medications   Accu-Chek FastClix Lancets Misc   Accu-Chek Guide test strip Generic drug: glucose blood   Accu-Chek Guide w/Device Kit   Blood Pressure Monitoring Kit   metFORMIN 500 MG 24 hr tablet Commonly known as: Glucophage XR   Misc. Devices Misc     TAKE these medications   acetaminophen 325 MG tablet Commonly known as: Tylenol Take 2 tablets (650 mg total) by mouth every 4 (four) hours as needed (for pain scale < 4).   ferrous sulfate 325 (65 FE) MG tablet Take 1 tablet (325 mg total) by mouth daily with breakfast.   ibuprofen 600 MG tablet Commonly known as: ADVIL Take 1 tablet (600 mg total) by mouth every 6 (six) hours.   polyethylene glycol powder 17 GM/SCOOP powder Commonly known as: GLYCOLAX/MIRALAX Take 255 g by mouth once for 1 dose.   prenatal multivitamin Tabs tablet Take 1 tablet by mouth daily at 12 noon.        Diet: routine diet  Activity: Advance as tolerated. Pelvic rest for 6 weeks.   Outpatient follow up:4 weeks Follow up Appt: Future Appointments  Date Time Provider [place]  07/26/2019  2:15 PM [place] [facility]  07/26/2019  2:15 PM [place] 4 WH-[facility]US [facility]-US   Follow up Visit:   Please schedule this patient for Postpartum visit in: 4 weeks with the following provider: Any provider High risk pregnancy complicated by: GDM Delivery mode:  SVD Anticipated Birth Control:  Depo PP Procedures needed: 2 hour GTT  Schedule Integrated [place] visit: no      Newborn Data: Live born female  Birth Weight: 2886 grams APGAR (1 MIN): 8 APGAR (5 MINS): 9   Newborn Delivery   Birth date/time: 07/24/2019 02:54:00 Delivery type: Vaginal, Spontaneous      Baby Feeding: Bottle Disposition:home with mother   07/25/2019 Clarnce Flock, MD

## 2019-07-24 NOTE — Anesthesia Postprocedure Evaluation (Signed)
Anesthesia Post Note  Patient: Sherri Schmidt  Procedure(s) Performed: AN AD HOC LABOR EPIDURAL     Patient location during evaluation: Mother Baby Anesthesia Type: Epidural Level of consciousness: awake and alert Pain management: pain level controlled Vital Signs Assessment: post-procedure vital signs reviewed and stable Respiratory status: spontaneous breathing, nonlabored ventilation and respiratory function stable Cardiovascular status: stable Postop Assessment: no headache, no backache and epidural receding Anesthetic complications: no    Last Vitals:  Vitals:   07/24/19 0957 07/24/19 1415  BP: 121/68 115/64  Pulse: 75 62  Resp: 18 18  Temp: 36.6 C 36.6 C  SpO2:  100%    Last Pain:  Vitals:   07/24/19 1415  TempSrc: Oral  PainSc:    Pain Goal:                   [facility] Records

## 2019-07-25 DIAGNOSIS — Z283 Underimmunization status: Secondary | ICD-10-CM

## 2019-07-25 DIAGNOSIS — O09899 Supervision of other high risk pregnancies, unspecified trimester: Secondary | ICD-10-CM

## 2019-07-25 DIAGNOSIS — Z2839 Other underimmunization status: Secondary | ICD-10-CM

## 2019-07-25 HISTORY — DX: Underimmunization status: Z28.3

## 2019-07-25 HISTORY — DX: Other underimmunization status: Z28.39

## 2019-07-25 MED ORDER — POLYETHYLENE GLYCOL 3350 17 GM/SCOOP PO POWD: 1.0000 | Freq: Once | ORAL | 0 refills | Status: AC

## 2019-07-25 MED ORDER — MEASLES, MUMPS & RUBELLA VAC IJ SOLR: 0.5000 mL | Freq: Once | INTRAMUSCULAR | Status: DC

## 2019-07-25 MED ORDER — IBUPROFEN 600 MG PO TABS: 600.0000 mg | ORAL_TABLET | Freq: Four times a day (QID) | ORAL | 0 refills | Status: DC

## 2019-07-25 MED ORDER — ABDOMINAL BINDER/ELASTIC LARGE MISC: 1.0000 | 0 refills | Status: DC | PRN

## 2019-07-25 MED ORDER — ACETAMINOPHEN 325 MG PO TABS: 650.0000 mg | ORAL_TABLET | ORAL | 0 refills | Status: DC | PRN

## 2019-07-25 NOTE — Progress Notes (Signed)
Post Partum Day 1 Subjective: Patient is doing well. Her pain is well controlled and she has been OOB ad lib, tolerating PO, voiding and passing flatus/BM.   Objective: Blood pressure 118/73, pulse 62, temperature (!) 97.5 F (36.4 C), temperature source Oral, resp. rate 16, height _0  (1.549 m), weight 81.7 kg, last menstrual period 10/23/2018, SpO2 100 %, unknown if currently breastfeeding.  Physical Exam:  General: alert, cooperative and no distress  Cardio: Normal S1, S2, no m/r/g Lochia: appropriate Uterine Fundus: firm DVT Evaluation: No evidence of DVT seen on physical exam. No significant calf/ankle edema.  Recent Labs    07/23/19 0815  HGB 11.4*  HCT 34.6*    Assessment/Plan: Sherri Schmidt is a 29 yo G3P2 PPD#1 from VD following IOL or [place]. Pregnancy was complicated by GBS+, but she had adequate chemoprophylaxis.   -Depo provera  -AM Fasting glucose -MMR vaccine -Discharge today     LOS: 2 days   Phill Mutter 07/25/2019, 7:40 AM

## 2019-07-25 NOTE — Discharge Instructions (Signed)

## 2019-07-26 ENCOUNTER — Ambulatory Visit (HOSPITAL_COMMUNITY): Payer: BC Managed Care – PPO

## 2019-09-05 ENCOUNTER — Ambulatory Visit: Payer: BC Managed Care – PPO | Admitting: Obstetrics and Gynecology

## 2019-09-05 ENCOUNTER — Other Ambulatory Visit: Payer: BC Managed Care – PPO

## 2019-09-12 ENCOUNTER — Other Ambulatory Visit: Payer: BC Managed Care – PPO

## 2019-09-23 ENCOUNTER — Ambulatory Visit (INDEPENDENT_AMBULATORY_CARE_PROVIDER_SITE_OTHER): Payer: BC Managed Care – PPO | Admitting: Obstetrics and Gynecology

## 2019-09-23 ENCOUNTER — Other Ambulatory Visit: Payer: Self-pay

## 2019-09-23 ENCOUNTER — Encounter: Payer: Self-pay | Admitting: Obstetrics and Gynecology

## 2019-09-23 ENCOUNTER — Other Ambulatory Visit: Payer: BC Managed Care – PPO

## 2019-09-23 DIAGNOSIS — N649 Disorder of breast, unspecified: Secondary | ICD-10-CM

## 2019-09-23 DIAGNOSIS — Z3042 Encounter for surveillance of injectable contraceptive: Secondary | ICD-10-CM

## 2019-09-23 DIAGNOSIS — Z8632 Personal history of gestational diabetes: Secondary | ICD-10-CM

## 2019-09-23 MED ORDER — AMOXICILLIN-POT CLAVULANATE 875-125 MG PO TABS: 1.0000 | ORAL_TABLET | Freq: Two times a day (BID) | ORAL | 1 refills | Status: DC

## 2019-09-23 MED ORDER — MEDROXYPROGESTERONE ACETATE 150 MG/ML IM SUSP: 150.0000 mg | INTRAMUSCULAR | 3 refills | Status: DC

## 2019-09-23 NOTE — Progress Notes (Addendum)
    Post Partum Visit Note  Sherri Schmidt is a 29 y.o. G85P2012 female who presents for a postpartum visit. She is 8 weeks postpartum following a normal spontaneous vaginal delivery.  I have fully reviewed the prenatal and intrapartum course. The delivery was at 39 gestational weeks.  Anesthesia: epidural. Postpartum course has been unremarkable. Baby is doing well. Baby is feeding by bottle - Similac Advance. Bleeding staining only and red. Bowel function is normal. Bladder function is normal. Patient is not sexually active. Contraception method is DEPO. Postpartum depression screening: negative.  H/O GDM  Pt also has concerns about breast lesion. Noted 1 month ago. Started as a "bump" and the spontaously drained. Pt is not breast feeding  Received Depo Provera in hospital  The following portions of the patient's history were reviewed and updated as appropriate: allergies, current medications, past family history, past medical history, past social history, past surgical history and problem list.  Review of Systems Pertinent items noted in HPI and remainder of comprehensive ROS otherwise negative.    Objective:  Weight 178 lb (80.7 kg), last menstrual period 10/23/2018, unknown if currently breastfeeding.  General:  alert   Breasts:  Left breast two areas with  purulent drainage, a fleshy lesion about 1 cm x 1 cm noted , no other palpable lesions or adenopathy  Lungs: clear to auscultation bilaterally  Heart:  regular rate and rhythm, S1, S2 normal, no murmur, click, rub or gallop  Abdomen: soft, non-tender; bowel sounds normal; no masses,  no organomegaly   Vulva:  not evaluated  Vagina: not evaluated  Cervix:  not evaluated  Corpus: not examined  Adnexa:  not evaluated  Rectal Exam: Not performed.        Assessment:    NL postpartum exam. Pap smear not done at today's visit.   Plan:   Essential components of care per ACOG recommendations:  1.  Mood and well being: Patient  with negative depression screening today. Reviewed local resources for support.  - Patient does not use tobacco. - hx of drug use? No    2. Infant care and feeding:  -Patient currently breastmilk feeding? No -Social determinants of health (SDOH) reviewed in EPIC. No concerns  3. Sexuality, contraception and birth spacing - Patient does not want a pregnancy in the next year.  Desired family size is undecided .  - Reviewed forms of contraception in tiered fashion. Patient received Depo Provera while in the hospital. Would like to continue. Rx sent to pharmacy - Discussed birth spacing of 18 months  4. Sleep and fatigue -Encouraged family/partner/community support of 4 hrs of uninterrupted sleep to help with mood and fatigue  5. Physical Recovery  - Discussed patients delivery and complications - Patient had a second degree laceration, perineal healing reviewed. Patient expressed understanding - Patient has urinary incontinence? No - Patient is safe to resume physical and sexual activity  6.  Health Maintenance - Last pap smear done 01/2019 and was normal with negative HPV.   7. No Chronic Disease  8. Breast Lesion Will start Augmentin and refer to General Surgery for further evaluation  9. H/O GDM Glucola today  Nettie Elm, MD [facility] [employer]

## 2019-09-23 NOTE — Addendum Note (Signed)
Addended by: Marya Landry D on: 09/23/2019 09:07 AM   Modules accepted: Orders

## 2019-09-23 NOTE — Patient Instructions (Signed)
Health Maintenance, Female Adopting a healthy lifestyle and getting preventive care are important in promoting health and wellness. Ask your health care provider about:  The right schedule for you to have regular tests and exams.  Things you can do on your own to prevent diseases and keep yourself healthy. What should I know about diet, weight, and exercise? Eat a healthy diet   Eat a diet that includes plenty of vegetables, fruits, low-fat dairy products, and lean protein.  Do not eat a lot of foods that are high in solid fats, added sugars, or sodium. Maintain a healthy weight Body mass index (BMI) is used to identify weight problems. It estimates body fat based on height and weight. Your health care provider can help determine your BMI and help you achieve or maintain a healthy weight. Get regular exercise Get regular exercise. This is one of the most important things you can do for your health. Most adults should:  Exercise for at least 150 minutes each week. The exercise should increase your heart rate and make you sweat (moderate-intensity exercise).  Do strengthening exercises at least twice a week. This is in addition to the moderate-intensity exercise.  Spend less time sitting. Even light physical activity can be beneficial. Watch cholesterol and blood lipids Have your blood tested for lipids and cholesterol at 27 years of age, then have this test every 5 years. Have your cholesterol levels checked more often if:  Your lipid or cholesterol levels are high.  You are older than 27 years of age.  You are at high risk for heart disease. What should I know about cancer screening? Depending on your health history and family history, you may need to have cancer screening at various ages. This may include screening for:  Breast cancer.  Cervical cancer.  Colorectal cancer.  Skin cancer.  Lung cancer. What should I know about heart disease, diabetes, and high blood  pressure? Blood pressure and heart disease  High blood pressure causes heart disease and increases the risk of stroke. This is more likely to develop in people who have high blood pressure readings, are of African descent, or are overweight.  Have your blood pressure checked: ? Every 3-5 years if you are 18-39 years of age. ? Every year if you are 40 years old or older. Diabetes Have regular diabetes screenings. This checks your fasting blood sugar level. Have the screening done:  Once every three years after age 40 if you are at a normal weight and have a low risk for diabetes.  More often and at a younger age if you are overweight or have a high risk for diabetes. What should I know about preventing infection? Hepatitis B If you have a higher risk for hepatitis B, you should be screened for this virus. Talk with your health care provider to find out if you are at risk for hepatitis B infection. Hepatitis C Testing is recommended for:  Everyone born from 1945 through 1965.  Anyone with known risk factors for hepatitis C. Sexually transmitted infections (STIs)  Get screened for STIs, including gonorrhea and chlamydia, if: ? You are sexually active and are younger than 27 years of age. ? You are older than 27 years of age and your health care provider tells you that you are at risk for this type of infection. ? Your sexual activity has changed since you were last screened, and you are at increased risk for chlamydia or gonorrhea. Ask your health care provider if   you are at risk.  Ask your health care provider about whether you are at high risk for HIV. Your health care provider may recommend a prescription medicine to help prevent HIV infection. If you choose to take medicine to prevent HIV, you should first get tested for HIV. You should then be tested every 3 months for as long as you are taking the medicine. Pregnancy  If you are about to stop having your period (premenopausal) and  you may become pregnant, seek counseling before you get pregnant.  Take 400 to 800 micrograms (mcg) of folic acid every day if you become pregnant.  Ask for birth control (contraception) if you want to prevent pregnancy. Osteoporosis and menopause Osteoporosis is a disease in which the bones lose minerals and strength with aging. This can result in bone fractures. If you are 65 years old or older, or if you are at risk for osteoporosis and fractures, ask your health care provider if you should:  Be screened for bone loss.  Take a calcium or vitamin D supplement to lower your risk of fractures.  Be given hormone replacement therapy (HRT) to treat symptoms of menopause. Follow these instructions at home: Lifestyle  Do not use any products that contain nicotine or tobacco, such as cigarettes, e-cigarettes, and chewing tobacco. If you need help quitting, ask your health care provider.  Do not use street drugs.  Do not share needles.  Ask your health care provider for help if you need support or information about quitting drugs. Alcohol use  Do not drink alcohol if: ? Your health care provider tells you not to drink. ? You are pregnant, may be pregnant, or are planning to become pregnant.  If you drink alcohol: ? Limit how much you use to 0-1 drink a day. ? Limit intake if you are breastfeeding.  Be aware of how much alcohol is in your drink. In the [place] one drink equals one 12 oz bottle of beer (355 mL), one 5 oz glass of wine (148 mL), or one 1 oz glass of hard liquor (44 mL). General instructions  Schedule regular health, dental, and eye exams.  Stay current with your vaccines.  Tell your health care provider if: ? You often feel depressed. ? You have ever been abused or do not feel safe at home. Summary  Adopting a healthy lifestyle and getting preventive care are important in promoting health and wellness.  Follow your health care provider's instructions about healthy  diet, exercising, and getting tested or screened for diseases.  Follow your health care provider's instructions on monitoring your cholesterol and blood pressure. This information is not intended to replace advice given to you by your health care provider. Make sure you discuss any questions you have with your health care provider. Document Revised: 06/16/2018 Document Reviewed: 06/16/2018 Elsevier Patient Education  2020 Elsevier Inc.  

## 2019-09-24 LAB — GLUCOSE TOLERANCE, 2 HOURS
Glucose, 2 hour: 106 mg/dL (ref 65–139)
Glucose, GTT - Fasting: 92 mg/dL (ref 65–99)

## 2019-10-19 ENCOUNTER — Ambulatory Visit (INDEPENDENT_AMBULATORY_CARE_PROVIDER_SITE_OTHER): Payer: BC Managed Care – PPO

## 2019-10-19 ENCOUNTER — Other Ambulatory Visit: Payer: Self-pay

## 2019-10-19 DIAGNOSIS — Z3042 Encounter for surveillance of injectable contraceptive: Secondary | ICD-10-CM | POA: Diagnosis not present

## 2019-10-19 MED ORDER — MEDROXYPROGESTERONE ACETATE 150 MG/ML IM SUSP
150.0000 mg | Freq: Once | INTRAMUSCULAR | Status: AC
  Administered 2019-10-19: 150 mg via INTRAMUSCULAR

## 2019-10-19 NOTE — Progress Notes (Signed)
Sherri Schmidt is here for a depo injection.  Pt tolerated injection well in the RD without complication. -EH/RMA

## 2019-10-20 NOTE — Progress Notes (Signed)
Patient ID: Sherri Schmidt, female   DOB: 12-Jan-1991, 29 y.o.   MRN: [id number] Patient seen and assessed by nursing staff during this encounter. I have reviewed the chart and agree with the documentation and plan. I have also made any necessary editorial changes.  Scheryl Darter, MD 10/20/2019 9:26 AM

## 2020-01-05 ENCOUNTER — Ambulatory Visit (INDEPENDENT_AMBULATORY_CARE_PROVIDER_SITE_OTHER): Payer: BC Managed Care – PPO

## 2020-01-05 ENCOUNTER — Other Ambulatory Visit: Payer: Self-pay

## 2020-01-05 DIAGNOSIS — Z3042 Encounter for surveillance of injectable contraceptive: Secondary | ICD-10-CM

## 2020-01-05 DIAGNOSIS — Z419 Encounter for procedure for purposes other than remedying health state, unspecified: Secondary | ICD-10-CM | POA: Diagnosis not present

## 2020-01-05 MED ORDER — MEDROXYPROGESTERONE ACETATE 150 MG/ML IM SUSP
150.0000 mg | Freq: Once | INTRAMUSCULAR | Status: AC
  Administered 2020-01-05: 150 mg via INTRAMUSCULAR

## 2020-01-05 NOTE — Progress Notes (Signed)
Nurse visit for pt supply Depo  Pt is on time for injection Depo given LD without difficulty  Depo due Sept 16-30, pt agrees

## 2020-01-05 NOTE — Progress Notes (Signed)
Patient was assessed and managed by nursing staff during this encounter. I have reviewed the chart and agree with the documentation and plan. I have also made any necessary editorial changes.  Catalina Antigua, MD 01/05/2020 2:43 PM

## 2020-01-17 IMAGING — US US MFM OB FOLLOW-UP
1 series · 12 of 28 positions shown · non-contrast
Comparison: none

[Series 1: us mfm ob follow-up · 64 acquisitions, 12 frames shown]
[im 3/64]
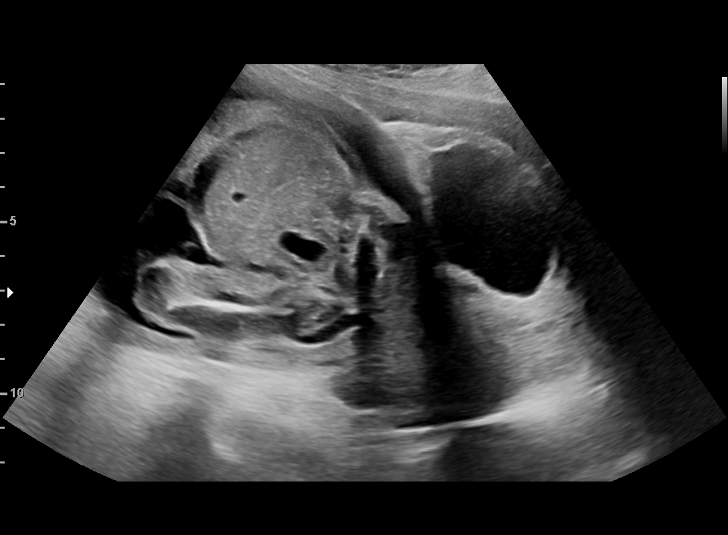
[im 8/64]
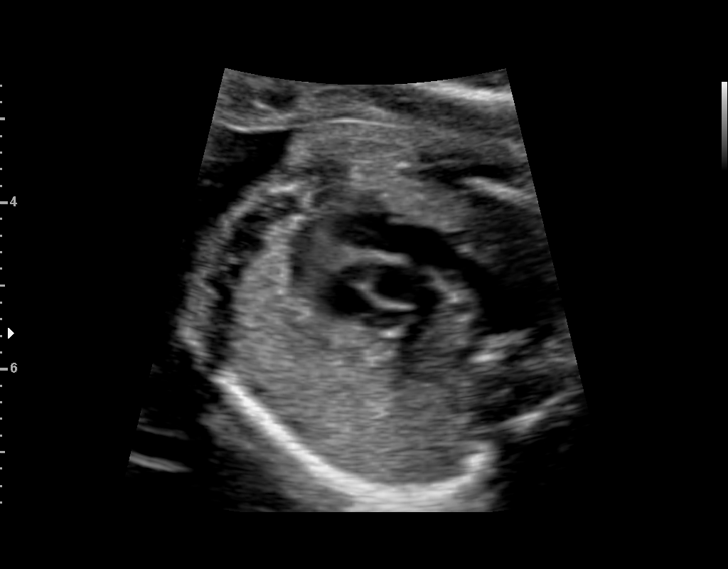
[im 12/64]
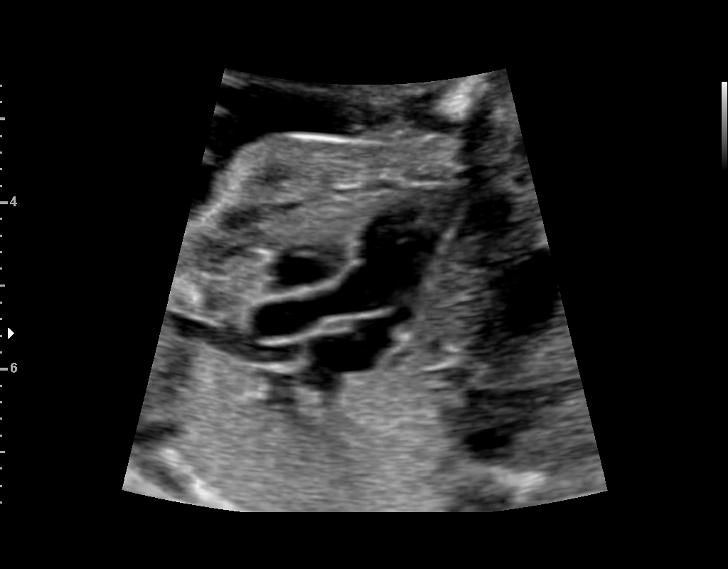
[im 19/64]
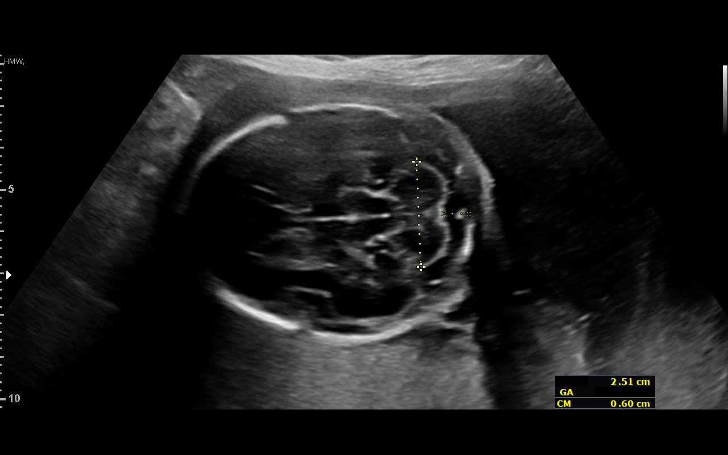
[im 24/64]
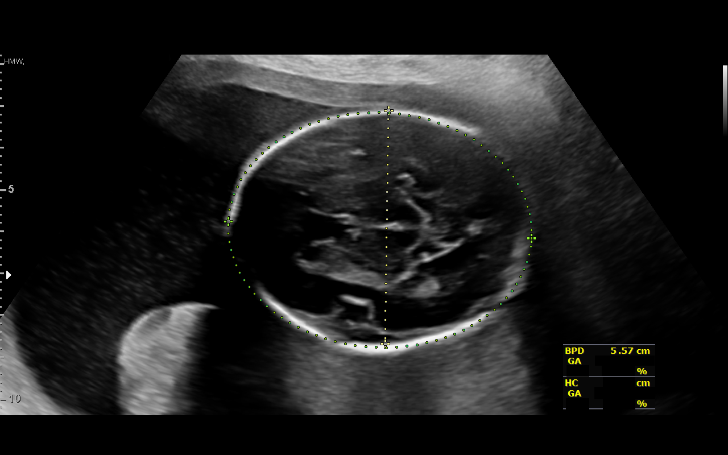
[im 29/64]
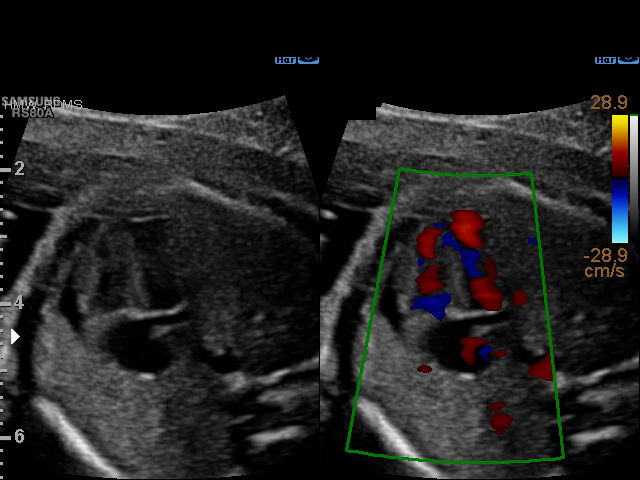
[im 36/64]
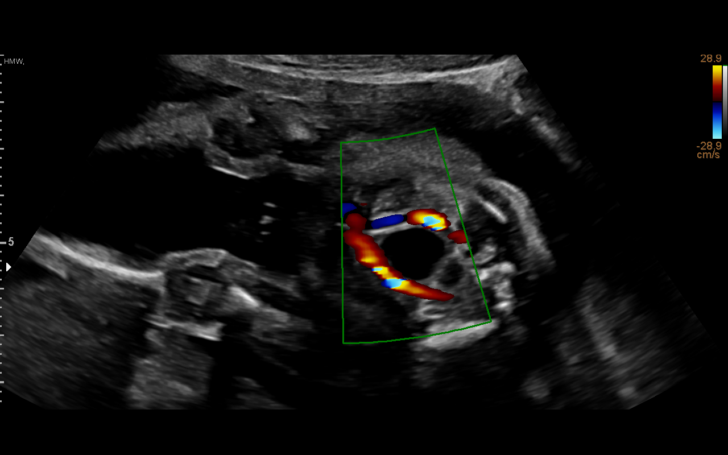
[im 40/64]
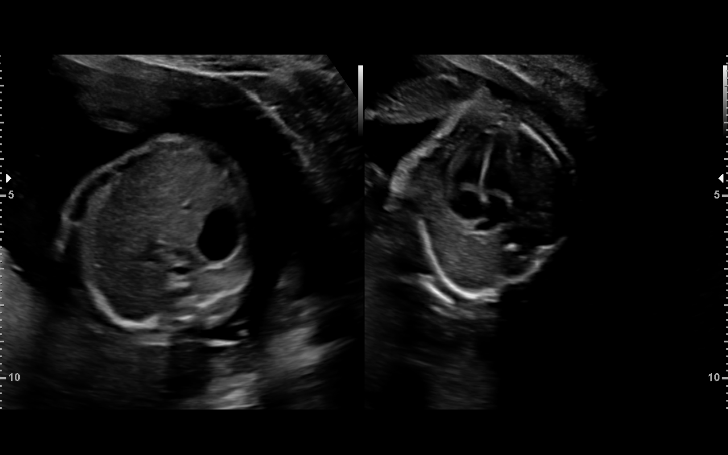
[im 45/64]
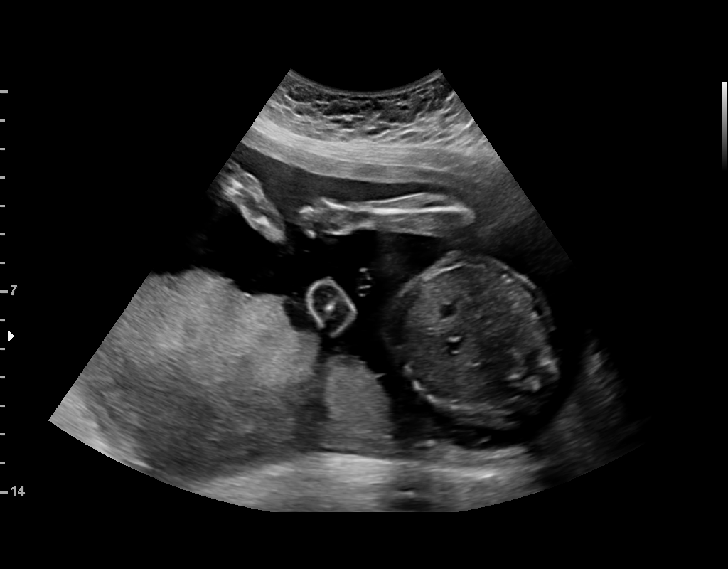
[im 52/64]
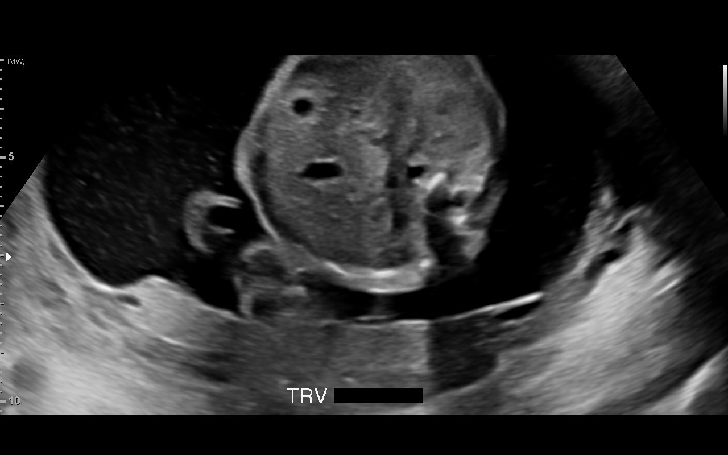
[im 57/64]
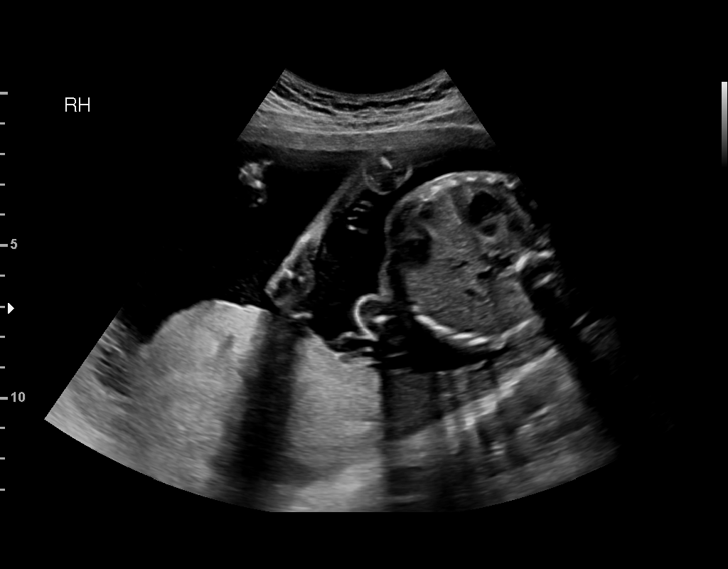
[im 61/64]
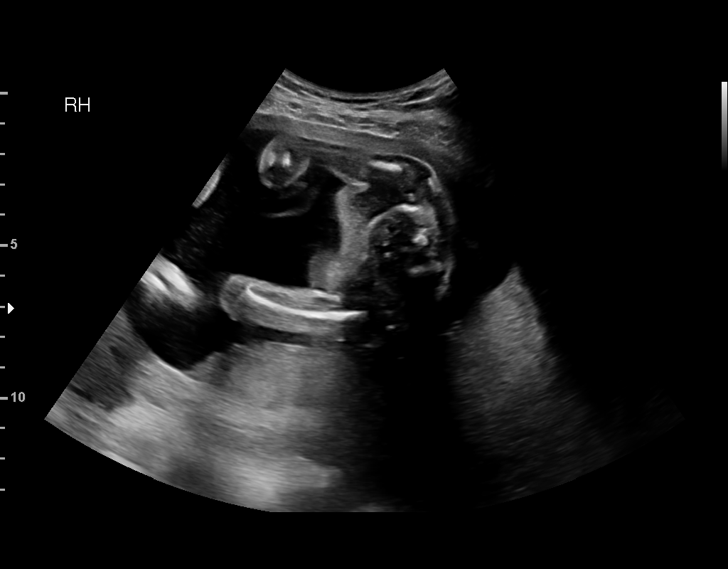

[12 of 28 positions shown; findings below may reference images not displayed]

CNM

                                                       KEMAJL
 ----------------------------------------------------------------------

 ----------------------------------------------------------------------
Indications

  22 weeks gestation of pregnancy
  Encounter for other antenatal screening
  follow-up( low risk NIPS, M.1RR)
  Silent Klaus Chery
 ----------------------------------------------------------------------
Vital Signs

                                                Height:        5'1"
Fetal Evaluation

 Num Of Fetuses:          1
 Fetal Heart Rate(bpm):   136
 Cardiac Activity:        Observed
 Presentation:            Breech
 Placenta:                Posterior
 P. Cord Insertion:       Previously Visualized

 Amniotic Fluid
 AFI FV:      Within normal limits

                             Largest Pocket(cm)

Biometry

 BPD:      55.4  mm     G. Age:  22w 6d         47  %    CI:        73.45   %    70 - 86
                                                         FL/HC:       18.2  %    19.2 -
 HC:      205.4  mm     G. Age:  22w 5d         28  %    HC/AC:       1.11       1.05 -
 AC:      185.7  mm     G. Age:  23w 3d         58  %    FL/BPD:      67.5  %    71 - 87
 FL:       37.4  mm     G. Age:  21w 6d         13  %    FL/AC:       20.1  %    20 - 24
 HUM:      35.1  mm     G. Age:  22w 0d         27  %
 CER:      25.1  mm     G. Age:  23w 1d         54  %
 CM:          6  mm

 Est. FW:     529   gm     1 lb 3 oz     36  %
OB History

 Gravidity:    3         Term:   1
 TOP:          1        Living:  1
Gestational Age

 U/S Today:     22w 5d                                        EDD:   08/03/19
 Best:          22w 6d     Det. By:  Previous Ultrasound      EDD:   08/02/19
                                     (01/05/19)
Anatomy

 Cranium:               Appears normal         Aortic Arch:            Previously seen
 Cavum:                 Appears normal         Ductal Arch:            Previously seen
 Ventricles:            Appears normal         Diaphragm:              Appears normal
 Choroid Plexus:        Appears normal         Stomach:                Appears normal, left
                                                                       sided
 Cerebellum:            Appears normal         Abdomen:                Appears normal
 Posterior Fossa:       Appears normal         Abdominal Wall:         Appears nml (cord
                                                                       insert, abd wall)
 Nuchal Fold:           Previously seen        Cord Vessels:           Appears normal (3
                                                                       vessel cord)
 Face:                  Appears normal         Kidneys:                Appear normal
                        (orbits and profile)
 Lips:                  Appears normal         Bladder:                Appears normal
 Thoracic:              Appears normal         Spine:                  Limited views
                                                                       previously seen
 Heart:                 Appears normal         Upper Extremities:      Previously seen
                        (4CH, axis, and
                        situs)
 RVOT:                  Appears normal         Lower Extremities:      Previously seen
 LVOT:                  Appears normal

 Other:  Male gender. Technically difficult due to maternal habitus and fetal
         position.
Cervix Uterus Adnexa

 Cervix
 Length:            3.1  cm.
 Normal appearance by transabdominal scan.

 Uterus
 No abnormality visualized.

 Left Ovary
 No adnexal mass visualized.

 Right Ovary
 No adnexal mass visualized.

 Cul De Sac
 No free fluid seen.

 Adnexa
 No abnormality visualized.
Impression

 Normal interval growth.
 Anatomy cleared today
Recommendations

 Follow up as clinically indicated.

## 2020-02-05 DIAGNOSIS — Z419 Encounter for procedure for purposes other than remedying health state, unspecified: Secondary | ICD-10-CM | POA: Diagnosis not present

## 2020-03-07 DIAGNOSIS — Z419 Encounter for procedure for purposes other than remedying health state, unspecified: Secondary | ICD-10-CM | POA: Diagnosis not present

## 2020-03-29 ENCOUNTER — Ambulatory Visit: Payer: BC Managed Care – PPO

## 2020-04-10 IMAGING — US US MFM FETAL BPP W/O NON-STRESS
1 series · 12 of 18 positions shown · non-contrast
Comparison: none

[Series 1: us mfm fetal bpp w/o non-stress · 18 acquisitions, 12 frames shown]
[im 1/18]
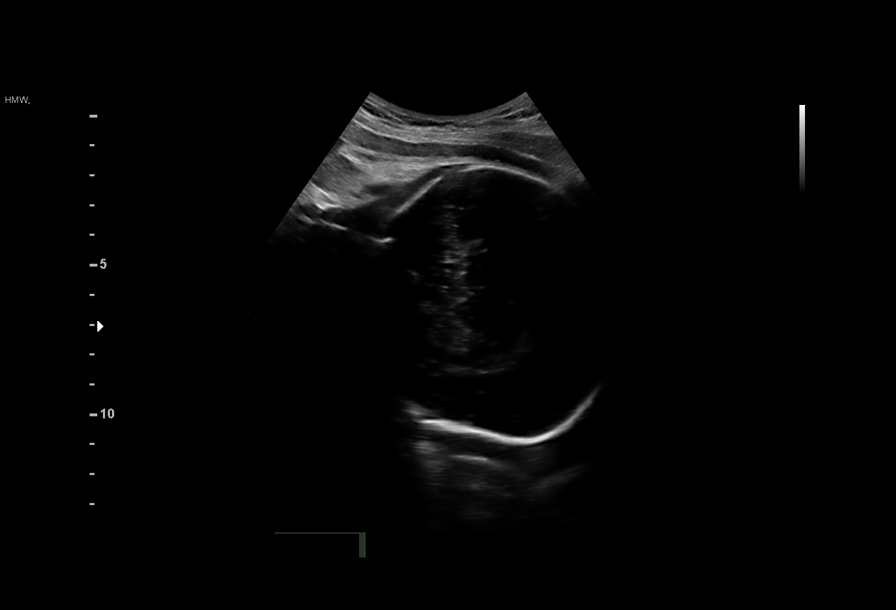
[im 3/18]
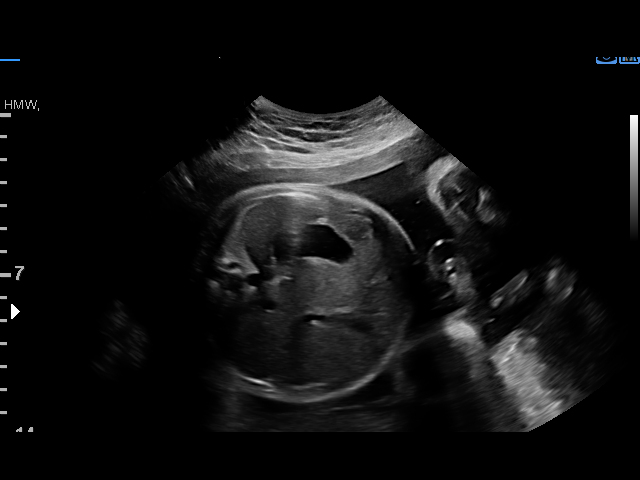
[im 4/18]
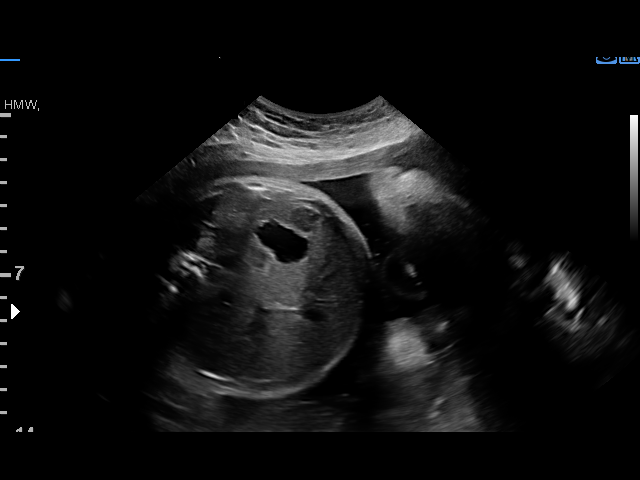
[im 6/18]
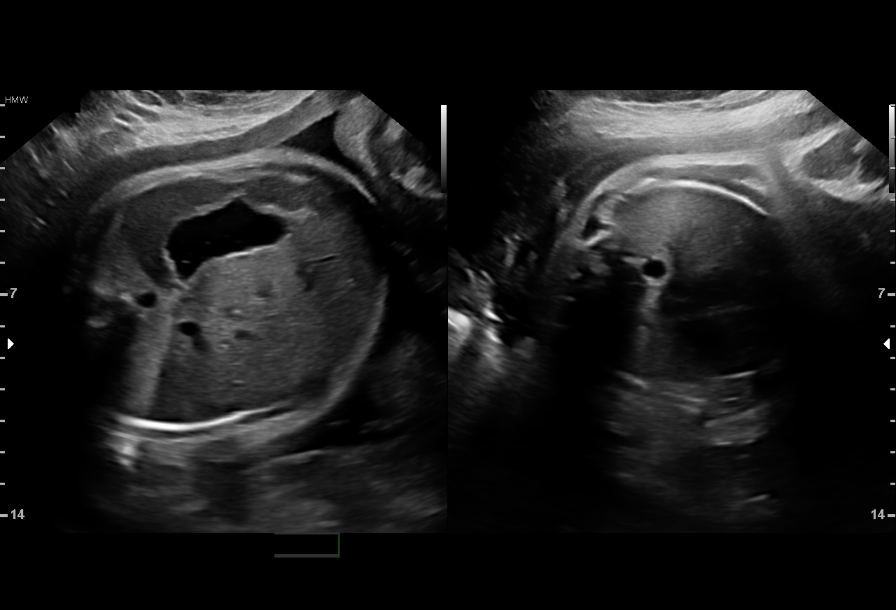
[im 7/18]
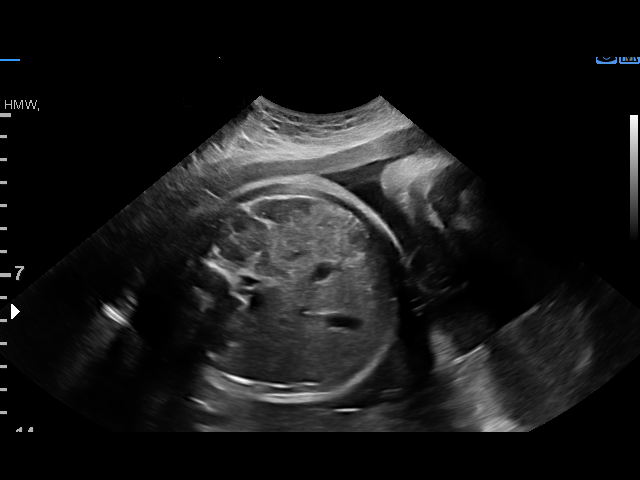
[im 9/18]
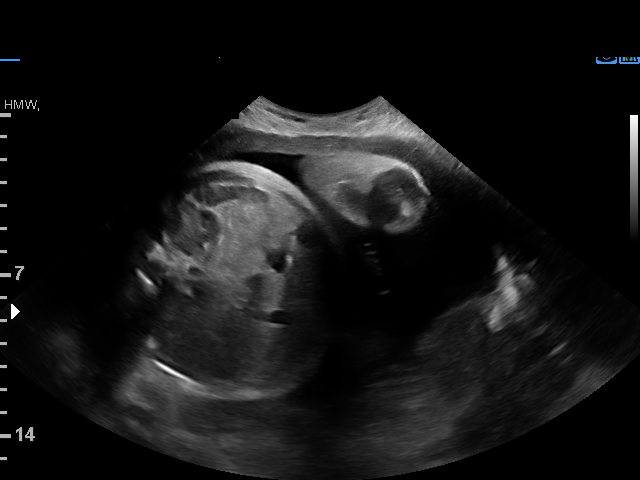
[im 10/18]
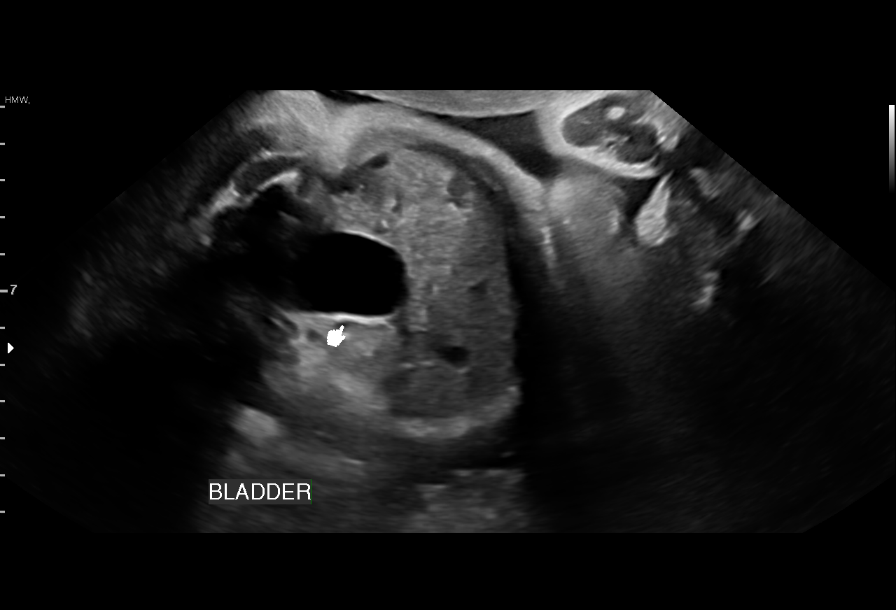
[im 12/18]
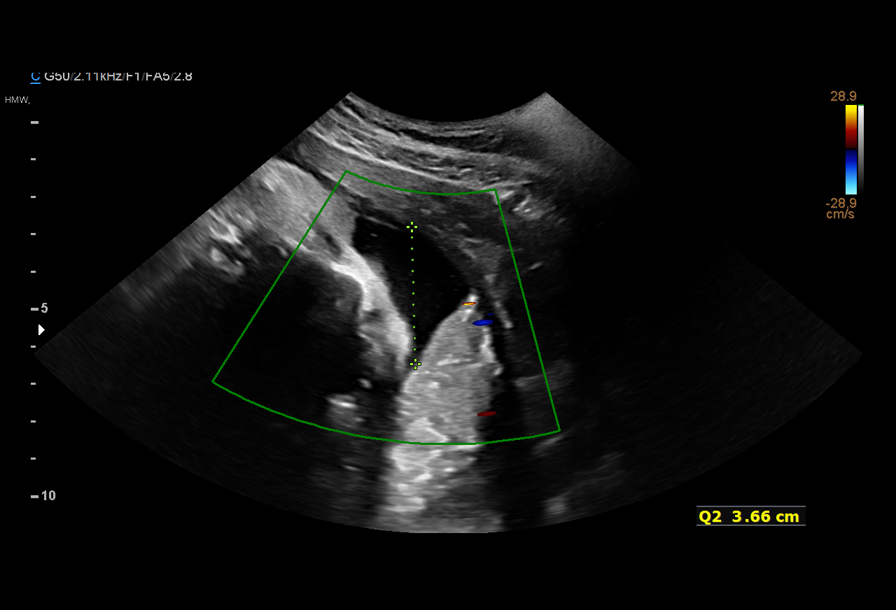
[im 13/18]
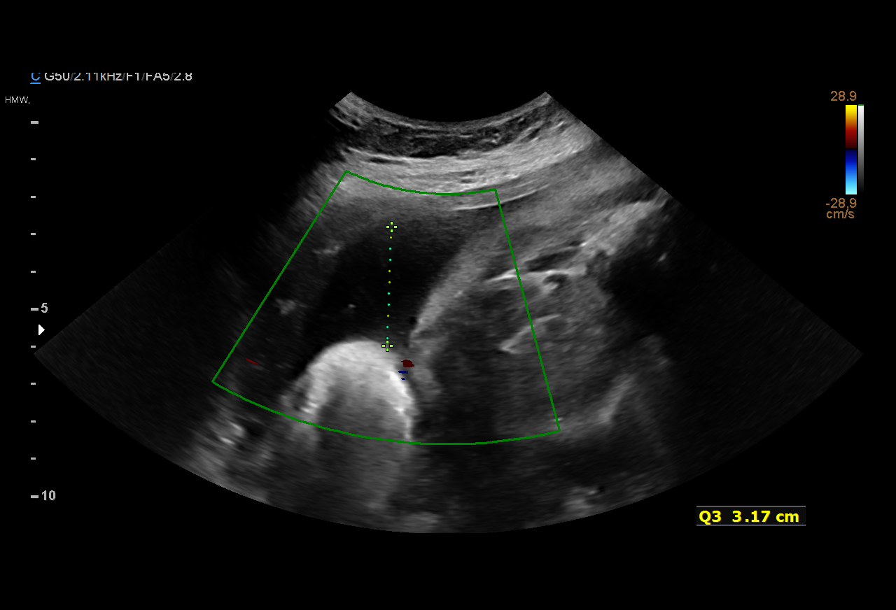
[im 15/18]
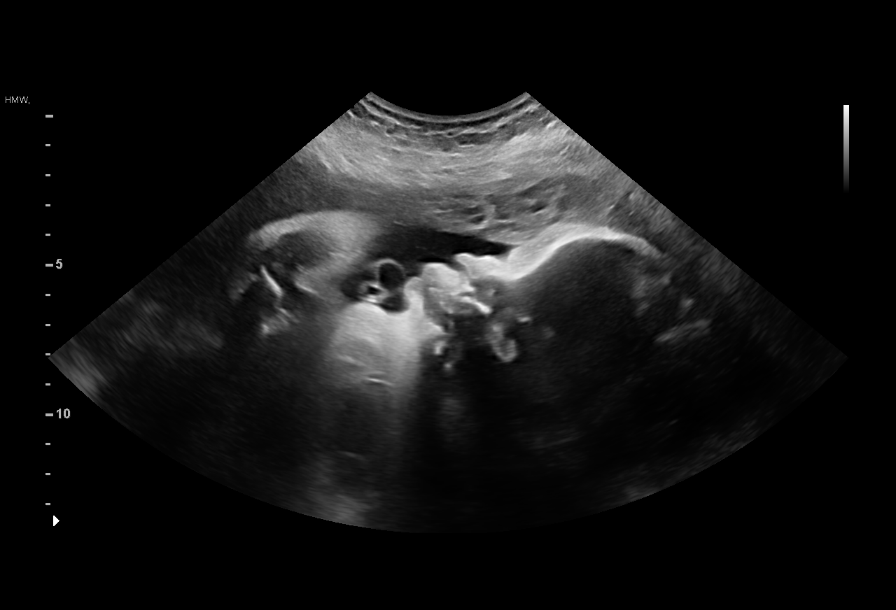
[im 16/18]
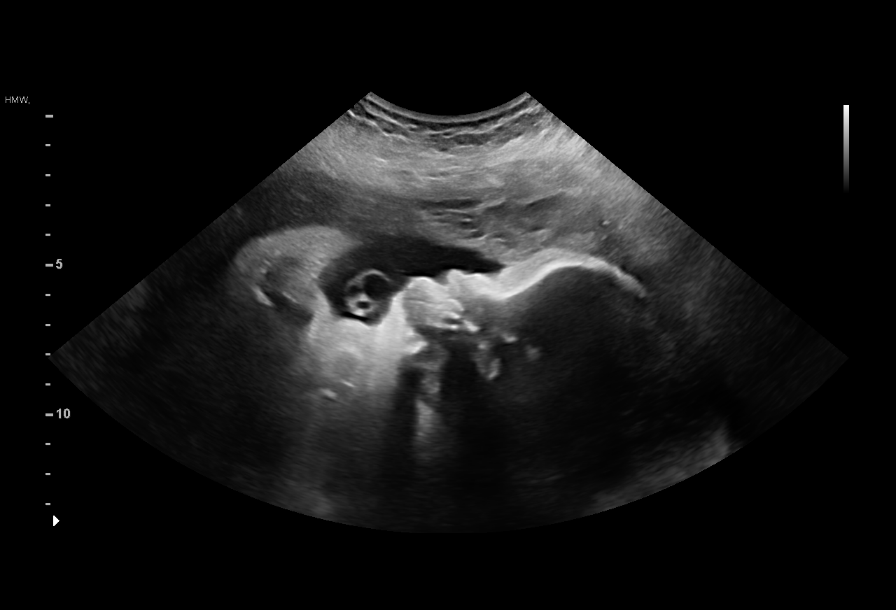
[im 18/18]
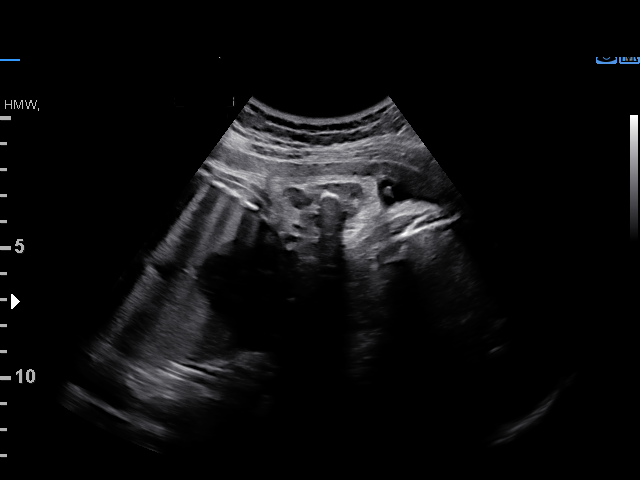

[12 of 18 positions shown; findings below may reference images not displayed]

----------------------------------------------------------------------

 ----------------------------------------------------------------------
Indications

  Gestational diabetes in pregnancy,
  controlled by oral hypoglycemic drugs
  34 weeks gestation of pregnancy
  Encounter for other antenatal screening
  follow-up (low risk NIPS)
  Anemia during pregnancy in third trimester
  Silent Govinda Garate
 ----------------------------------------------------------------------
Vital Signs

                                                Height:        5'1"
Fetal Evaluation

 Num Of Fetuses:         1
 Fetal Heart Rate(bpm):  151
 Cardiac Activity:       Observed
 Presentation:           Cephalic

 Amniotic Fluid
 AFI FV:      Within normal limits

 AFI Sum(cm)     %Tile       Largest Pocket(cm)
 13.82           48

 RUQ(cm)       RLQ(cm)       LUQ(cm)        LLQ(cm)

Biophysical Evaluation
 Amniotic F.V:   Within normal limits       F. Tone:        Observed
 F. Movement:    Observed                   Score:          [DATE]
 F. Breathing:   Observed
OB History

 Gravidity:    3         Term:   1
 TOP:          1        Living:  1
Gestational Age

 Best:          34w 6d     Det. By:  Previous Ultrasound      EDD:   08/02/19
                                     (01/05/19)
Anatomy

 Thoracic:              Appears normal         Bladder:                Appears normal
 Stomach:               Appears normal, left
                        sided
Comments

 A biophysical profile performed today due to A2 gestational
 diabetes currently treated with Metformin was [DATE].
 There was normal amniotic fluid noted on today's ultrasound
 exam.
 A follow-up exam was scheduled in 1 week.

## 2020-06-06 DIAGNOSIS — Z419 Encounter for procedure for purposes other than remedying health state, unspecified: Secondary | ICD-10-CM | POA: Diagnosis not present

## 2020-07-07 DIAGNOSIS — Z419 Encounter for procedure for purposes other than remedying health state, unspecified: Secondary | ICD-10-CM | POA: Diagnosis not present

## 2020-08-04 DIAGNOSIS — J029 Acute pharyngitis, unspecified: Secondary | ICD-10-CM | POA: Diagnosis not present

## 2020-08-07 DIAGNOSIS — Z419 Encounter for procedure for purposes other than remedying health state, unspecified: Secondary | ICD-10-CM | POA: Diagnosis not present

## 2020-09-04 DIAGNOSIS — Z419 Encounter for procedure for purposes other than remedying health state, unspecified: Secondary | ICD-10-CM | POA: Diagnosis not present

## 2020-10-05 DIAGNOSIS — Z419 Encounter for procedure for purposes other than remedying health state, unspecified: Secondary | ICD-10-CM | POA: Diagnosis not present

## 2020-11-04 DIAGNOSIS — Z419 Encounter for procedure for purposes other than remedying health state, unspecified: Secondary | ICD-10-CM | POA: Diagnosis not present

## 2020-12-05 DIAGNOSIS — Z419 Encounter for procedure for purposes other than remedying health state, unspecified: Secondary | ICD-10-CM | POA: Diagnosis not present

## 2020-12-31 ENCOUNTER — Other Ambulatory Visit: Payer: Self-pay

## 2020-12-31 ENCOUNTER — Ambulatory Visit (INDEPENDENT_AMBULATORY_CARE_PROVIDER_SITE_OTHER): Payer: BC Managed Care – PPO

## 2020-12-31 VITALS — BP 146/77 | HR 87 | Ht 61.0 in | Wt 180.0 lb

## 2020-12-31 DIAGNOSIS — N912 Amenorrhea, unspecified: Secondary | ICD-10-CM

## 2020-12-31 DIAGNOSIS — Z3201 Encounter for pregnancy test, result positive: Secondary | ICD-10-CM

## 2020-12-31 DIAGNOSIS — O3680X0 Pregnancy with inconclusive fetal viability, not applicable or unspecified: Secondary | ICD-10-CM

## 2020-12-31 LAB — POCT URINE PREGNANCY: Preg Test, Ur: POSITIVE — AB

## 2020-12-31 NOTE — Progress Notes (Signed)
Sherri Schmidt presents today for UPT. She has no unusual complaints and complains of nausea with vomiting for a few days. LMP: unknown    OBJECTIVE: Appears well, in no apparent distress.  OB History     Gravida  3   Para  2   Term  2   Preterm      AB  1   Living  2      SAB      IAB  1   Ectopic      Multiple  0   Live Births  2          Home UPT Result: 12/27/20 In-Office UPT result: 12/31/20 I have reviewed the patient's medical, obstetrical, social, and family histories, and medications.   ASSESSMENT: Positive pregnancy test  PLAN Prenatal care to be completed at: [facility].  Pt unsure of LMP, will need dating ultrasound.

## 2020-12-31 NOTE — Addendum Note (Signed)
Addended by: Charlsie Quest B on: 12/31/2020 04:13 PM   Modules accepted: Orders

## 2021-01-04 DIAGNOSIS — Z419 Encounter for procedure for purposes other than remedying health state, unspecified: Secondary | ICD-10-CM | POA: Diagnosis not present

## 2021-01-12 ENCOUNTER — Other Ambulatory Visit: Payer: Self-pay

## 2021-01-12 ENCOUNTER — Encounter (HOSPITAL_COMMUNITY): Payer: Self-pay | Admitting: Obstetrics & Gynecology

## 2021-01-12 ENCOUNTER — Inpatient Hospital Stay (HOSPITAL_COMMUNITY)
Admission: AD | Admit: 2021-01-12 | Discharge: 2021-01-12 | Disposition: A | Discharge status: Home / Self Care | Payer: BC Managed Care – PPO | Attending: Obstetrics & Gynecology | Admitting: Obstetrics & Gynecology

## 2021-01-12 DIAGNOSIS — Z8249 Family history of ischemic heart disease and other diseases of the circulatory system: Secondary | ICD-10-CM | POA: Insufficient documentation

## 2021-01-12 DIAGNOSIS — Z793 Long term (current) use of hormonal contraceptives: Secondary | ICD-10-CM | POA: Diagnosis not present

## 2021-01-12 DIAGNOSIS — R109 Unspecified abdominal pain: Secondary | ICD-10-CM

## 2021-01-12 DIAGNOSIS — O26891 Other specified pregnancy related conditions, first trimester: Secondary | ICD-10-CM | POA: Diagnosis present

## 2021-01-12 DIAGNOSIS — O26899 Other specified pregnancy related conditions, unspecified trimester: Secondary | ICD-10-CM

## 2021-01-12 DIAGNOSIS — Z3A12 12 weeks gestation of pregnancy: Secondary | ICD-10-CM

## 2021-01-12 LAB — URINALYSIS, ROUTINE W REFLEX MICROSCOPIC
Bacteria, UA: NONE SEEN
Bilirubin Urine: NEGATIVE
Glucose, UA: NEGATIVE mg/dL
Hgb urine dipstick: NEGATIVE
Ketones, ur: NEGATIVE mg/dL
Nitrite: NEGATIVE
Protein, ur: NEGATIVE mg/dL
Specific Gravity, Urine: 1.008 (ref 1.005–1.030)
pH: 7 (ref 5.0–8.0)

## 2021-01-12 LAB — WET PREP, GENITAL
Sperm: NONE SEEN
Trich, Wet Prep: NONE SEEN
Yeast Wet Prep HPF POC: NONE SEEN

## 2021-01-12 NOTE — MAU Provider Note (Signed)
History     CSN: [id number]  Arrival date and time: 01/12/21 1356   Event Date/Time   First Provider Initiated Contact with Patient 01/12/21 1501      Chief Complaint  Patient presents with   Abdominal Pain   HPI Sherri Schmidt is a 30 y.o. [id number] at [redacted]w[redacted]d by LMP who presents with abdominal cramping. Reports abdominal cramping today. Took 2 tylenol for which has improved her symptoms. Rates pain 10/10. No aggravating factors. Denies fever/chills, dysuria, vaginal bleeding, vaginal discharge, or n/v/d.    OB History     Gravida  4   Para  2   Term  2   Preterm      AB  1   Living  2      SAB      IAB  1   Ectopic      Multiple  0   Live Births  2           Past Medical History:  Diagnosis Date   Gestational diabetes     Past Surgical History:  Procedure Laterality Date   NO PAST SURGERIES      Family History  Problem Relation Age of Onset   Hypertension Mother    Diabetes Mother     Social History   Tobacco Use   Smoking status: Never   Smokeless tobacco: Never  Vaping Use   Vaping Use: Never used  Substance Use Topics   Alcohol use: No   Drug use: No    Allergies:  Allergies  Allergen Reactions   Other Rash    Chlorox products--rash    Medications Prior to Admission  Medication Sig Dispense Refill Last Dose   acetaminophen (TYLENOL) 325 MG tablet Take 2 tablets (650 mg total) by mouth every 4 (four) hours as needed (for pain scale < 4). 30 tablet 0    Elastic Bandages & Supports (ABDOMINAL BINDER/ELASTIC LARGE) MISC 1 Device by Does not apply route as needed. 1 each 0    ibuprofen (ADVIL) 600 MG tablet Take 1 tablet (600 mg total) by mouth every 6 (six) hours. 30 tablet 0    medroxyPROGESTERone (DEPO-PROVERA) 150 MG/ML injection Inject 1 mL (150 mg total) into the muscle every 3 (three) months. 1 mL 3    Prenatal Vit-Fe Fumarate-FA (PRENATAL MULTIVITAMIN) TABS tablet Take 1 tablet by mouth daily at 12 noon.       Review  of Systems  Constitutional: Negative.   Gastrointestinal:  Positive for abdominal pain. Negative for diarrhea, nausea and vomiting.  Genitourinary: Negative.   Physical Exam   Blood pressure 100/85, pulse 75, resp. rate 17, last menstrual period 10/20/2020, SpO2 100 %, unknown if currently breastfeeding.  Physical Exam Vitals and nursing note reviewed. Exam conducted with a chaperone present.  Constitutional:      Appearance: She is well-developed.  HENT:     Head: Normocephalic and atraumatic.  Eyes:     General: No scleral icterus. Pulmonary:     Effort: Pulmonary effort is normal. No respiratory distress.  Abdominal:     General: Abdomen is flat. There is no distension.     Palpations: Abdomen is soft.     Tenderness: There is no abdominal tenderness.  Genitourinary:    Vagina: No bleeding.     Comments: Cervix closed/thick Skin:    General: Skin is warm and dry.  Neurological:     Mental Status: She is alert.  Psychiatric:  Mood and Affect: Mood normal.        Behavior: Behavior normal.    MAU Course  Procedures Results for orders placed or performed during the hospital encounter of 01/12/21 (from the past 24 hour(s))  Urinalysis, Routine w reflex microscopic Urine, Clean Catch     Status: Abnormal   Collection Time: 01/12/21  2:05 PM  Result Value Ref Range   Color, Urine STRAW (A) YELLOW   APPearance CLEAR CLEAR   Specific Gravity, Urine 1.008 1.005 - 1.030   pH 7.0 5.0 - 8.0   Glucose, UA NEGATIVE NEGATIVE mg/dL   Hgb urine dipstick NEGATIVE NEGATIVE   Bilirubin Urine NEGATIVE NEGATIVE   Ketones, ur NEGATIVE NEGATIVE mg/dL   Protein, ur NEGATIVE NEGATIVE mg/dL   Nitrite NEGATIVE NEGATIVE   Leukocytes,Ua TRACE (A) NEGATIVE   RBC / HPF 0-5 0 - 5 RBC/hpf   WBC, UA 0-5 0 - 5 WBC/hpf   Bacteria, UA NONE SEEN NONE SEEN   Squamous Epithelial / LPF 0-5 0 - 5    MDM FHT obtained by RN via doppler. BSUS performed for patient reassurance.   Pt informed  that the ultrasound is considered a limited OB ultrasound and is not intended to be a complete ultrasound exam.  Patient also informed that the ultrasound is not being completed with the intent of assessing for fetal or placental anomalies or any pelvic abnormalities.  Explained that the purpose of today's ultrasound is to assess for   pt reassurance .  Patient acknowledges the purpose of the exam and the limitations of the study.   Active IUP. FHR 146 bpm. ~[redacted]w[redacted]d by CRL (will defer dating to official ultrasound scheduled for Monday)  Cervix is closed. Pain improved with tylenol taken at home. U/a negative.   Assessment and Plan   1. Abdominal cramping affecting pregnancy   2. [redacted] weeks gestation of pregnancy    -keep scheduled ultrasound on Monday -take tylenol prn -GC/CT pending -reviewed reasons to return to MAU  Judeth Horn 01/12/2021, 3:01 PM

## 2021-01-12 NOTE — Discharge Instructions (Addendum)
Return to care  If you have heavier bleeding that soaks through more than 2 pads per hour for an hour or more If you bleed so much that you feel like you might pass out or you do pass out If you have significant abdominal pain that is not improved with Tylenol    Safe Medications in Pregnancy   Acne: Benzoyl Peroxide Salicylic Acid  Backache/Headache: Tylenol: 2 regular strength every 4 hours OR              2 Extra strength every 6 hours  Colds/Coughs/Allergies: Benadryl (alcohol free) 25 mg every 6 hours as needed Breath right strips Claritin Cepacol throat lozenges Chloraseptic throat spray Cold-Eeze- up to three times per day Cough drops, alcohol free Flonase (by prescription only) Guaifenesin Mucinex Robitussin DM (plain only, alcohol free) Saline nasal spray/drops Sudafed (pseudoephedrine) & Actifed ** use only after [redacted] weeks gestation and if you do not have high blood pressure Tylenol Vicks Vaporub Zinc lozenges Zyrtec   Constipation: Colace Ducolax suppositories Fleet enema Glycerin suppositories Metamucil Milk of magnesia Miralax Senokot Smooth move tea  Diarrhea: Kaopectate Imodium A-D  *NO pepto Bismol  Hemorrhoids: Anusol Anusol HC Preparation H Tucks  Indigestion: Tums Maalox Mylanta Zantac  Pepcid  Insomnia: Benadryl (alcohol free) 25mg every 6 hours as needed Tylenol PM Unisom, no Gelcaps  Leg Cramps: Tums MagGel  Nausea/Vomiting:  Bonine Dramamine Emetrol Ginger extract Sea bands Meclizine  Nausea medication to take during pregnancy:  Unisom (doxylamine succinate 25 mg tablets) Take one tablet daily at bedtime. If symptoms are not adequately controlled, the dose can be increased to a maximum recommended dose of two tablets daily (1/2 tablet in the morning, 1/2 tablet mid-afternoon and one at bedtime). Vitamin B6 100mg tablets. Take one tablet twice a day (up to 200 mg per day).  Skin Rashes: Aveeno  products Benadryl cream or 25mg every 6 hours as needed Calamine Lotion 1% cortisone cream  Yeast infection: Gyne-lotrimin 7 Monistat 7  Gum/tooth pain: Anbesol  **If taking multiple medications, please check labels to avoid duplicating the same active ingredients **take medication as directed on the label ** Do not exceed 4000 mg of tylenol in 24 hours **Do not take medications that contain aspirin or ibuprofen    

## 2021-01-12 NOTE — MAU Note (Addendum)
...  Sherri Schmidt is a 30 y.o. at approximately [redacted]w[redacted]d here in MAU reporting: intermittent lower abdominal cramping that began around 0500 this morning. She states the pain starts in the middle and radiates out to the sides. Rates 10/10. No VB or abnormal discharge. States she has irregular periods so unsure of how far along she is.  Lab orders placed from triage: UA

## 2021-01-14 ENCOUNTER — Ambulatory Visit
Admission: RE | Admit: 2021-01-14 | Discharge: 2021-01-14 | Disposition: A | Discharge status: Home / Self Care | Payer: BC Managed Care – PPO | Source: Ambulatory Visit | Attending: Obstetrics and Gynecology | Admitting: Obstetrics and Gynecology

## 2021-01-14 ENCOUNTER — Other Ambulatory Visit: Payer: Self-pay

## 2021-01-14 ENCOUNTER — Other Ambulatory Visit: Payer: Self-pay | Admitting: Obstetrics and Gynecology

## 2021-01-14 ENCOUNTER — Telehealth: Payer: Self-pay | Admitting: Medical

## 2021-01-14 DIAGNOSIS — O3680X0 Pregnancy with inconclusive fetal viability, not applicable or unspecified: Secondary | ICD-10-CM | POA: Diagnosis present

## 2021-01-14 LAB — GC/CHLAMYDIA PROBE AMP (~~LOC~~) NOT AT ARMC
Chlamydia: NEGATIVE
Comment: NEGATIVE
Comment: NORMAL
Neisseria Gonorrhea: NEGATIVE

## 2021-01-14 NOTE — Telephone Encounter (Signed)
I called Sherri Schmidt today at 12:41 PM and confirmed patient's identity using two patient identifiers. [place] results from earlier today were reviewed. Patient is not yet scheduled for new OB visit. She plans to return to [facility] for care as she has with previous pregnancies. She will call to make an appointment. First trimester warning signs reviewed. Patient voiced understanding and had no further questions.   US OB Comp Less 14 Wks  Result Date: 01/14/2021 CLINICAL DATA:  Dating in a 30 year old female. Unsure of last menstrual cycle. No quantitative beta HCG is available. EXAM: OBSTETRIC <14 WK ULTRASOUND TECHNIQUE: Transabdominal ultrasound was performed for evaluation of the gestation as well as the maternal uterus and adnexal regions. COMPARISON:  Prior obstetric sonograms. No recent comparison for this pregnancy. FINDINGS: Intrauterine gestational sac: Single Yolk sac:  Visualized Embryo:  Visualize Cardiac Activity: Visualized Heart Rate: 169 beats per minute bpm CRL: 26.5 mm   9 w 4 d                  [place] EDC: 08/15/2021 Subchorionic hemorrhage:  None visualized. Maternal uterus/adnexae: Unremarkable appearance of uterus and adnexa. Normal appearance of bilateral ovaries. The no free fluid. IMPRESSION: Single intrauterine gestation at 9 weeks 4 days by crown-rump length with fetal heart rate of 169 beats per minute. Electronically Signed   By: Donzetta Kohut M.D.   On: 01/14/2021 10:24    Marny Lowenstein, PA-C 01/14/2021 12:41 PM

## 2021-02-04 DIAGNOSIS — Z419 Encounter for procedure for purposes other than remedying health state, unspecified: Secondary | ICD-10-CM | POA: Diagnosis not present

## 2021-02-12 ENCOUNTER — Ambulatory Visit: Payer: BC Managed Care – PPO

## 2021-02-12 DIAGNOSIS — O0990 Supervision of high risk pregnancy, unspecified, unspecified trimester: Secondary | ICD-10-CM

## 2021-02-12 DIAGNOSIS — Z3490 Encounter for supervision of normal pregnancy, unspecified, unspecified trimester: Secondary | ICD-10-CM | POA: Insufficient documentation

## 2021-02-12 NOTE — Progress Notes (Signed)
New OB Intake  I connected with  Sherri Schmidt on 02/12/21 at  9:00 AM EDT by telephone  and verified that I am speaking with the correct person using two identifiers. Nurse is located at [facility] and pt is located at home.  I discussed the limitations, risks, security and privacy concerns of performing an evaluation and management service by telephone and the availability of in person appointments. I also discussed with the patient that there may be a patient responsible charge related to this service. The patient expressed understanding and agreed to proceed.  I explained I am completing New OB Intake today. We discussed her EDD of 08/15/2021 that is based on dating ultrasound completed on 01/14/21 placing patient at [redacted]w[redacted]d. Pt is G4/P2. I reviewed her allergies, medications, Medical/Surgical/OB history, and appropriate screenings. I informed her of [facility] services. Based on history, this is a/an  pregnancy complicated by alpha thalassemia and history of gestational diabetes  .   Patient Active Problem List   Diagnosis Date Noted   History of gestational diabetes 09/23/2019   Breast lesion 09/23/2019   Alpha thalassemia silent carrier 02/14/2019    Concerns addressed today  Delivery Plans:  Plans to deliver at [facility].   MyChart/Babyscripts MyChart access verified. I explained pt will have some visits in office and some virtually. Babyscripts instructions given and order placed. Patient verifies receipt of registration text/e-mail. Account successfully created and app downloaded.  Blood Pressure Cuff  Patient has private insurance; instructed to purchase blood pressure cuff and bring to first prenatal appt. Explained after first prenatal appt pt will check weekly and document in Babyscripts.  Weight scale: Patient    have weight scale. Weight scale ordered for patient to pick up form [facility].   Anatomy US Explained first scheduled [place] will be around 19 weeks. Anatomy US will  be scheduled at new ob appointment on 03/05/21.   Labs Discussed Avelina Laine genetic screening with patient. Patient will not need Horizon screening but would like to have Panorama drawn. Routine prenatal labs needed.  Covid Vaccine Patient has had covid vaccine.   Mother/ Baby Dyad Candidate?    If yes, offer as possibility  Informed patient of [facility] website  and placed link in her AVS.   Social Determinants of Health Food Insecurity: Patient denies food insecurity. WIC Referral: Patient is interested in referral to [facility].  Transportation: Patient expressed transportation needs. Transportation Services reviewed with patient; patient registered and phone number provided for patient to schedule rides. Childcare: Discussed no children allowed at ultrasound appointments. Offered childcare services; patient declines childcare services at this time.   Placed OB Box on problem list and updated  First visit review I reviewed new OB appt with pt. I explained she will have a pelvic exam, ob bloodwork with genetic screening, and PAP smear. Explained pt will be seen by Dr. Clearance Coots at first visit; encounter routed to appropriate provider. Explained that patient will be seen by pregnancy navigator following visit with provider. [facility] information placed in AVS.   Oneita Hurt, [facility] 02/12/2021  8:52 AM

## 2021-02-19 ENCOUNTER — Encounter: Payer: BC Managed Care – PPO | Admitting: Obstetrics

## 2021-03-05 ENCOUNTER — Ambulatory Visit (INDEPENDENT_AMBULATORY_CARE_PROVIDER_SITE_OTHER): Payer: BC Managed Care – PPO | Admitting: Obstetrics

## 2021-03-05 ENCOUNTER — Encounter: Payer: Self-pay | Admitting: Obstetrics

## 2021-03-05 ENCOUNTER — Other Ambulatory Visit: Payer: Self-pay

## 2021-03-05 ENCOUNTER — Other Ambulatory Visit (HOSPITAL_COMMUNITY)
Admission: RE | Admit: 2021-03-05 | Discharge: 2021-03-05 | Disposition: A | Discharge status: Home / Self Care | Payer: BC Managed Care – PPO | Source: Ambulatory Visit | Attending: Obstetrics | Admitting: Obstetrics

## 2021-03-05 VITALS — BP 113/69 | HR 82 | Wt 181.0 lb

## 2021-03-05 DIAGNOSIS — Z3480 Encounter for supervision of other normal pregnancy, unspecified trimester: Secondary | ICD-10-CM

## 2021-03-05 DIAGNOSIS — O23592 Infection of other part of genital tract in pregnancy, second trimester: Secondary | ICD-10-CM | POA: Insufficient documentation

## 2021-03-05 DIAGNOSIS — O219 Vomiting of pregnancy, unspecified: Secondary | ICD-10-CM

## 2021-03-05 DIAGNOSIS — Z8632 Personal history of gestational diabetes: Secondary | ICD-10-CM

## 2021-03-05 DIAGNOSIS — Z3482 Encounter for supervision of other normal pregnancy, second trimester: Secondary | ICD-10-CM | POA: Diagnosis present

## 2021-03-05 DIAGNOSIS — Z3A16 16 weeks gestation of pregnancy: Secondary | ICD-10-CM | POA: Insufficient documentation

## 2021-03-05 DIAGNOSIS — B9689 Other specified bacterial agents as the cause of diseases classified elsewhere: Secondary | ICD-10-CM | POA: Insufficient documentation

## 2021-03-05 LAB — HEPATITIS C ANTIBODY: HCV Ab: NEGATIVE

## 2021-03-05 MED ORDER — DOXYLAMINE-PYRIDOXINE 10-10 MG PO TBEC: DELAYED_RELEASE_TABLET | ORAL | 5 refills | Status: DC

## 2021-03-05 MED ORDER — PNV-DHA+DOCUSATE 27-1.25-300 MG PO CAPS
1.0000 | ORAL_CAPSULE | Freq: Every day | ORAL | 4 refills | Status: DC
Start: 2021-03-05 — End: 2024-01-21

## 2021-03-05 NOTE — Progress Notes (Signed)
Subjective:    Sherri Schmidt is being seen today for her first obstetrical visit.  This is a planned pregnancy. She is at [redacted]w[redacted]d gestation. Her obstetrical history is significant for  GDM . Relationship with FOB: significant other, living together. Patient does intend to breast feed. Pregnancy history fully reviewed.  The information documented in the HPI was reviewed and verified.  Menstrual History: OB History     Gravida  4   Para  2   Term  2   Preterm      AB  1   Living  2      SAB      IAB  1   Ectopic      Multiple  0   Live Births  2            Patient's last menstrual period was 10/20/2020.    Past Medical History:  Diagnosis Date   Gestational diabetes     Past Surgical History:  Procedure Laterality Date   NO PAST SURGERIES      (Not in a hospital admission)  Allergies  Allergen Reactions   Other Rash    Chlorox products--rash    Social History   Tobacco Use   Smoking status: Never   Smokeless tobacco: Never  Substance Use Topics   Alcohol use: No    Family History  Problem Relation Age of Onset   Hypertension Mother    Diabetes Mother      Review of Systems Constitutional: negative for Schmidt loss Gastrointestinal: negative for vomiting Genitourinary:negative for genital lesions and vaginal discharge and dysuria Musculoskeletal:negative for back pain Behavioral/Psych: negative for abusive relationship, depression, illegal drug usage and tobacco use    Objective:    BP 113/69   Pulse 82   Wt 181 lb (82.1 kg)   LMP 10/20/2020   BMI 34.20 kg/m  General Appearance:    Alert, cooperative, no distress, appears stated age  Head:    Normocephalic, without obvious abnormality, atraumatic  Eyes:    PERRL, conjunctiva/corneas clear, EOM's intact, fundi    benign, both eyes  Ears:    Normal TM's and external ear canals, both ears  Nose:   Nares normal, septum midline, mucosa normal, no drainage    or sinus tenderness   Throat:   Lips, mucosa, and tongue normal; teeth and gums normal  Neck:   Supple, symmetrical, trachea midline, no adenopathy;    thyroid:  no enlargement/tenderness/nodules; no carotid   bruit or JVD  Back:     Symmetric, no curvature, ROM normal, no CVA tenderness  Lungs:     Clear to auscultation bilaterally, respirations unlabored  Chest Wall:    No tenderness or deformity   Heart:    Regular rate and rhythm, S1 and S2 normal, no murmur, rub   or gallop  Breast Exam:    No tenderness, masses, or nipple abnormality  Abdomen:     Soft, non-tender, bowel sounds active all four quadrants,    no masses, no organomegaly  Genitalia:    Normal female without lesion, discharge or tenderness  Extremities:   Extremities normal, atraumatic, no cyanosis or edema  Pulses:   2+ and symmetric all extremities  Skin:   Skin color, texture, turgor normal, no rashes or lesions  Lymph nodes:   Cervical, supraclavicular, and axillary nodes normal  Neurologic:   CNII-XII intact, normal strength, sensation and reflexes    throughout      Lab Review Urine  pregnancy test Labs reviewed yes Radiologic studies reviewed no  Assessment:    Pregnancy at [redacted]w[redacted]d weeks    Plan::    1. Supervision of other normal pregnancy, antepartum Rx: - Cytology - PAP( [place]) - Cervicovaginal ancillary only( [place]) - Obstetric Panel, Including HIV - Hepatitis C antibody - Culture, OB Urine - Genetic Screening - AFP, Serum, Open Spina Bifida - Hemoglobin A1c - [place] MFM OB DETAIL +14 WK; Future - Prenat-FeFum-DSS-FA-DHA w/o A (PNV-DHA+DOCUSATE) 27-1.25-300 MG CAPS; Take 1 capsule by mouth daily before breakfast.  Dispense: 90 capsule; Refill: 4  2. Nausea and vomiting during pregnancy Rx: - Doxylamine-Pyridoxine (DICLEGIS) 10-10 MG TBEC; 1 tab in AM, 1 tab mid afternoon 2 tabs at bedtime. Max dose 4 tabs daily.  Dispense: 100 tablet; Refill: 5  3. History of gestational diabetes    Prenatal vitamins.   Counseling provided regarding continued use of seat belts, cessation of alcohol consumption, smoking or use of illicit drugs; infection precautions i.e., influenza/TDAP immunizations, toxoplasmosis,CMV, parvovirus, listeria and varicella; workplace safety, exercise during pregnancy; routine dental care, safe medications, sexual activity, hot tubs, saunas, pools, travel, caffeine use, fish and methlymercury, potential toxins, hair treatments, varicose veins Schmidt gain recommendations per IOM guidelines reviewed: underweight/BMI< 18.5--> gain 28 - 40 lbs; normal Schmidt/BMI 18.5 - 24.9--> gain 25 - 35 lbs; overweight/BMI 25 - 29.9--> gain 15 - 25 lbs; obese/BMI >30->gain  11 - 20 lbs Problem list reviewed and updated. FIRST/CF mutation testing/NIPT/QUAD SCREEN/fragile X/Ashkenazi Jewish population testing/Spinal muscular atrophy discussed: requested. Role of ultrasound in pregnancy discussed; fetal survey: requested. Amniocentesis discussed: not indicated   Orders Placed This Encounter  Procedures   Culture, OB Urine   [place] MFM OB DETAIL +14 WK    Standing Status:   Future    Standing Expiration Date:   03/05/2022    Order Specific Question:   Reason for Exam (SYMPTOM  OR DIAGNOSIS REQUIRED)    Answer:   Anatomy    Order Specific Question:   Preferred Location    Answer:   [facility]   Obstetric Panel, Including HIV   Hepatitis C antibody   Genetic Screening   AFP, Serum, Open Spina Bifida    Order Specific Question:   Is patient insulin dependent?    Answer:   No    Order Specific Question:   Gestational Age (GA), weeks    Answer:   16.5    Order Specific Question:   Date on which patient was at this GA    Answer:   03/05/2021    Order Specific Question:   GA Calculation Method    Answer:   LMP    Order Specific Question:   Number of fetuses    Answer:   1   Hemoglobin A1c    Follow up in 4 weeks.  I have spent a total of 20 minutes of face-to-face time, excluding clinical  staff time, reviewing notes and preparing to see patient, ordering tests and/or medications, and counseling the patient.    Brock Bad, MD 03/06/2021 5:22 AM

## 2021-03-05 NOTE — Progress Notes (Signed)
Pt states she is having N&V with gagging. Pt is having some cramping, no bleeding.

## 2021-03-06 LAB — CERVICOVAGINAL ANCILLARY ONLY
Bacterial Vaginitis (gardnerella): POSITIVE — AB
Candida Glabrata: NEGATIVE
Candida Vaginitis: NEGATIVE
Chlamydia: NEGATIVE
Comment: NEGATIVE
Comment: NEGATIVE
Comment: NEGATIVE
Comment: NEGATIVE
Comment: NEGATIVE
Comment: NORMAL
Neisseria Gonorrhea: NEGATIVE
Trichomonas: NEGATIVE

## 2021-03-07 ENCOUNTER — Other Ambulatory Visit: Payer: Self-pay | Admitting: Obstetrics

## 2021-03-07 ENCOUNTER — Encounter: Payer: Self-pay | Admitting: *Deleted

## 2021-03-07 DIAGNOSIS — B9689 Other specified bacterial agents as the cause of diseases classified elsewhere: Secondary | ICD-10-CM

## 2021-03-07 DIAGNOSIS — Z419 Encounter for procedure for purposes other than remedying health state, unspecified: Secondary | ICD-10-CM | POA: Diagnosis not present

## 2021-03-07 LAB — OBSTETRIC PANEL, INCLUDING HIV
Antibody Screen: NEGATIVE
Basophils Absolute: 0 10*3/uL (ref 0.0–0.2)
Basos: 0 %
EOS (ABSOLUTE): 0 10*3/uL (ref 0.0–0.4)
Eos: 1 %
HIV Screen 4th Generation wRfx: NONREACTIVE
Hematocrit: 34.2 % (ref 34.0–46.6)
Hemoglobin: 11.4 g/dL (ref 11.1–15.9)
Hepatitis B Surface Ag: NEGATIVE
Immature Grans (Abs): 0 10*3/uL (ref 0.0–0.1)
Immature Granulocytes: 0 %
Lymphocytes Absolute: 1.7 10*3/uL (ref 0.7–3.1)
Lymphs: 31 %
MCH: 28.9 pg (ref 26.6–33.0)
MCHC: 33.3 g/dL (ref 31.5–35.7)
MCV: 87 fL (ref 79–97)
Monocytes Absolute: 0.3 10*3/uL (ref 0.1–0.9)
Monocytes: 6 %
Neutrophils Absolute: 3.3 10*3/uL (ref 1.4–7.0)
Neutrophils: 62 %
Platelets: 212 10*3/uL (ref 150–450)
RBC: 3.95 x10E6/uL (ref 3.77–5.28)
RDW: 12.3 % (ref 11.7–15.4)
RPR Ser Ql: NONREACTIVE
Rh Factor: POSITIVE
Rubella Antibodies, IGG: 2.83 index (ref >0.99)
WBC: 5.4 10*3/uL (ref 3.4–10.8)

## 2021-03-07 LAB — AFP, SERUM, OPEN SPINA BIFIDA
AFP MoM: 1.46
AFP Value: 53.2 ng/mL
Gest. Age on Collection Date: 16.5 weeks
Maternal Age At EDD: 28.3 yr
OSBR Risk 1 IN: 6095
Test Results:: NEGATIVE
Weight: 181 [lb_av]

## 2021-03-07 LAB — HEPATITIS C ANTIBODY: Hep C Virus Ab: <0.1 s/co ratio (ref 0.0–0.9)

## 2021-03-07 LAB — HEMOGLOBIN A1C
Est. average glucose Bld gHb Est-mCnc: 108 mg/dL
Hgb A1c MFr Bld: 5.4 % (ref 4.8–5.6)

## 2021-03-07 LAB — CYTOLOGY - PAP: Diagnosis: NEGATIVE

## 2021-03-07 MED ORDER — CLINDAMYCIN HCL 300 MG PO CAPS: 300.0000 mg | ORAL_CAPSULE | Freq: Three times a day (TID) | ORAL | 0 refills | Status: DC

## 2021-03-07 NOTE — Progress Notes (Signed)
Correction to previous note. RX sent for Clindamycin not Flagyl. Patient message correct in MyChart. Patient education on BV included.

## 2021-03-07 NOTE — Progress Notes (Signed)
Patient is active in MyChart. Message sent regarding Flagyl RX for BV.

## 2021-03-08 LAB — CULTURE, OB URINE

## 2021-03-08 LAB — URINE CULTURE, OB REFLEX

## 2021-03-13 ENCOUNTER — Encounter: Payer: Self-pay | Admitting: Obstetrics

## 2021-03-27 ENCOUNTER — Ambulatory Visit: Payer: BC Managed Care – PPO | Admitting: *Deleted

## 2021-03-27 ENCOUNTER — Ambulatory Visit: Payer: BC Managed Care – PPO | Attending: Obstetrics

## 2021-03-27 ENCOUNTER — Other Ambulatory Visit: Payer: Self-pay

## 2021-03-27 ENCOUNTER — Encounter: Payer: Self-pay | Admitting: *Deleted

## 2021-03-27 VITALS — BP 114/64 | HR 86

## 2021-03-27 DIAGNOSIS — Z3689 Encounter for other specified antenatal screening: Secondary | ICD-10-CM | POA: Diagnosis present

## 2021-03-27 DIAGNOSIS — Z3480 Encounter for supervision of other normal pregnancy, unspecified trimester: Secondary | ICD-10-CM | POA: Diagnosis present

## 2021-03-31 NOTE — Progress Notes (Signed)
   PRENATAL VISIT NOTE  Subjective:  Sherri Schmidt is a 30 y.o. [phone] at [redacted]w[redacted]d being seen today for ongoing prenatal care.  She is currently monitored for the following issues for this low-risk pregnancy and has Alpha thalassemia silent carrier; History of gestational diabetes; Breast lesion; and Supervision of high risk pregnancy, antepartum on their problem list.  Patient reports  HA and low abdominal pain while she is at work. She works at [employer] full time.  She has been there 8 years.   Her abdominal pain is low and in her groin/pubic area. It is constant when it comes and worse when she is on her feet all day.  Contractions: Not present. Vag. Bleeding: None.  Movement: Present. Denies leaking of fluid.   The following portions of the patient's history were reviewed and updated as appropriate: allergies, current medications, past family history, past medical history, past social history, past surgical history and problem list.   Objective:   Vitals:   04/02/21 1323  BP: 110/74  Pulse: 84  Weight: 180 lb 14.4 oz (82.1 kg)    Fetal Status:     Movement: Present   FHT normal  General:  Alert, oriented and cooperative. Patient is in no acute distress.  Skin: Skin is warm and dry. No rash noted.   Cardiovascular: Normal heart rate noted  Respiratory: Normal respiratory effort, no problems with respiration noted  Abdomen: Soft, gravid, appropriate for gestational age.  Pain/Pressure: Absent     Pelvic: Cervical exam deferred        Extremities: Normal range of motion.     Mental Status: Normal mood and affect. Normal behavior. Normal judgment and thought content.   Assessment and Plan:  Pregnancy: [id number] at [redacted]w[redacted]d 1. History of gestational diabetes - A1C recently was 5.4 - Will do 2 hr at 28w per routine  2. Supervision of high risk pregnancy, antepartum - Completed and normal anatomy - AFP wnl - Offered flu shot - Declines  Preterm labor symptoms and general obstetric  precautions including but not limited to vaginal bleeding, contractions, leaking of fluid and fetal movement were reviewed in detail with the patient. Please refer to After Visit Summary for other counseling recommendations.   Return in about 4 weeks (around 04/30/2021) for OB VISIT, MD or APP.  Future Appointments  Date Time Provider Department Center  04/30/2021  1:30 PM [place]Kir[place], Wilmer Floor, CNM [facility] None     Milas Hock, MD

## 2021-04-02 ENCOUNTER — Other Ambulatory Visit: Payer: Self-pay

## 2021-04-02 ENCOUNTER — Ambulatory Visit: Payer: BC Managed Care – PPO | Admitting: Obstetrics and Gynecology

## 2021-04-02 ENCOUNTER — Encounter: Payer: Self-pay | Admitting: Obstetrics and Gynecology

## 2021-04-02 VITALS — BP 110/74 | HR 84 | Wt 180.9 lb

## 2021-04-02 DIAGNOSIS — O0990 Supervision of high risk pregnancy, unspecified, unspecified trimester: Secondary | ICD-10-CM

## 2021-04-02 DIAGNOSIS — Z8632 Personal history of gestational diabetes: Secondary | ICD-10-CM

## 2021-04-02 NOTE — Progress Notes (Signed)
Abdominal pain  Headache on/off

## 2021-04-06 DIAGNOSIS — Z419 Encounter for procedure for purposes other than remedying health state, unspecified: Secondary | ICD-10-CM | POA: Diagnosis not present

## 2021-04-30 ENCOUNTER — Other Ambulatory Visit: Payer: Self-pay

## 2021-04-30 ENCOUNTER — Ambulatory Visit (INDEPENDENT_AMBULATORY_CARE_PROVIDER_SITE_OTHER): Payer: BC Managed Care – PPO | Admitting: Advanced Practice Midwife

## 2021-04-30 VITALS — BP 118/69 | HR 89 | Wt 182.0 lb

## 2021-04-30 DIAGNOSIS — O26892 Other specified pregnancy related conditions, second trimester: Secondary | ICD-10-CM

## 2021-04-30 DIAGNOSIS — Z3490 Encounter for supervision of normal pregnancy, unspecified, unspecified trimester: Secondary | ICD-10-CM

## 2021-04-30 DIAGNOSIS — R519 Headache, unspecified: Secondary | ICD-10-CM

## 2021-04-30 DIAGNOSIS — Z3A24 24 weeks gestation of pregnancy: Secondary | ICD-10-CM

## 2021-04-30 NOTE — Progress Notes (Signed)
Pt states that she has been having more frequent HA's x couple a week.  Pt does not really take any medication.

## 2021-04-30 NOTE — Progress Notes (Signed)
   PRENATAL VISIT NOTE  Subjective:  Sherri Schmidt is a 30 y.o. [phone] at [redacted]w[redacted]d being seen today for ongoing prenatal care.  She is currently monitored for the following issues for this low-risk pregnancy and has Alpha thalassemia silent carrier; History of gestational diabetes; Breast lesion; and Supervision of low-risk pregnancy on their problem list.  Patient reports headache.  Contractions: Not present. Vag. Bleeding: None.  Movement: Present. Denies leaking of fluid.   The following portions of the patient's history were reviewed and updated as appropriate: allergies, current medications, past family history, past medical history, past social history, past surgical history and problem list.   Objective:   Vitals:   04/30/21 1336  BP: 118/69  Pulse: 89  Weight: 182 lb (82.6 kg)    Fetal Status: Fetal Heart Rate (bpm): 150   Movement: Present     General:  Alert, oriented and cooperative. Patient is in no acute distress.  Skin: Skin is warm and dry. No rash noted.   Cardiovascular: Normal heart rate noted  Respiratory: Normal respiratory effort, no problems with respiration noted  Abdomen: Soft, gravid, appropriate for gestational age.  Pain/Pressure: Present     Pelvic: Cervical exam deferred        Extremities: Normal range of motion.     Mental Status: Normal mood and affect. Normal behavior. Normal judgment and thought content.   Assessment and Plan:  Pregnancy: [id number] at [redacted]w[redacted]d 1. Encounter for supervision of low-risk pregnancy, antepartum --Anticipatory guidance about next visits/weeks of pregnancy given. --Next visit in 3-4 weeks for GTT  2. [redacted] weeks gestation of pregnancy   3. Headache in pregnancy, antepartum, second trimester --Neuro exam wnl, BP low normal --Pt not taking anything for headaches. Discussed increasing PO fluids, use a small amount of caffeine PRN, and Tylenol PRN.   --No anemia on initial prenatal with Hgb 11.4 but encouraged pt to increase  iron rich foods and take PNV daily. Labs next visit.  Preterm labor symptoms and general obstetric precautions including but not limited to vaginal bleeding, contractions, leaking of fluid and fetal movement were reviewed in detail with the patient. Please refer to After Visit Summary for other counseling recommendations.   No follow-ups on file.  Future Appointments  Date Time Provider Department Center  05/21/2021  9:15 AM [facility] None  05/21/2021  9:35 AM Conan Bowens, MD [facility] None     Sharen Counter, CNM

## 2021-05-02 DIAGNOSIS — R519 Headache, unspecified: Secondary | ICD-10-CM | POA: Insufficient documentation

## 2021-05-02 DIAGNOSIS — O26892 Other specified pregnancy related conditions, second trimester: Secondary | ICD-10-CM | POA: Insufficient documentation

## 2021-05-07 DIAGNOSIS — Z419 Encounter for procedure for purposes other than remedying health state, unspecified: Secondary | ICD-10-CM | POA: Diagnosis not present

## 2021-05-21 ENCOUNTER — Other Ambulatory Visit: Payer: BC Managed Care – PPO

## 2021-05-21 ENCOUNTER — Encounter: Payer: Self-pay | Admitting: Obstetrics and Gynecology

## 2021-05-21 ENCOUNTER — Ambulatory Visit (INDEPENDENT_AMBULATORY_CARE_PROVIDER_SITE_OTHER): Payer: BC Managed Care – PPO | Admitting: Obstetrics and Gynecology

## 2021-05-21 ENCOUNTER — Other Ambulatory Visit: Payer: Self-pay

## 2021-05-21 VITALS — BP 113/75 | HR 101 | Wt 182.1 lb

## 2021-05-21 DIAGNOSIS — Z3493 Encounter for supervision of normal pregnancy, unspecified, third trimester: Secondary | ICD-10-CM

## 2021-05-21 DIAGNOSIS — Z8632 Personal history of gestational diabetes: Secondary | ICD-10-CM

## 2021-05-21 DIAGNOSIS — Z3A27 27 weeks gestation of pregnancy: Secondary | ICD-10-CM

## 2021-05-21 NOTE — Progress Notes (Signed)
ROB 27.5 wks GTT, CBC, HIV, RPR today  A+, Rhogam NA Depression and anxiety screen negative. Declined TDAP.

## 2021-05-21 NOTE — Progress Notes (Addendum)
   PRENATAL VISIT NOTE  Subjective:  Sherri Schmidt is a 30 y.o. [phone] at [redacted]w[redacted]d being seen today for ongoing prenatal care.  She is currently monitored for the following issues for this high-risk pregnancy and has Alpha thalassemia silent carrier; History of gestational diabetes; Breast lesion; Supervision of low-risk pregnancy; and Headache in pregnancy, antepartum, second trimester on their problem list.  Patient reports headache.  Contractions: Not present. Vag. Bleeding: None.  Movement: Present. Denies leaking of fluid.   The following portions of the patient's history were reviewed and updated as appropriate: allergies, current medications, past family history, past medical history, past social history, past surgical history and problem list.   Objective:   Vitals:   05/21/21 0925  BP: 113/75  Pulse: (!) 101  Weight: 182 lb 1.6 oz (82.6 kg)    Fetal Status: Fetal Heart Rate (bpm): 153 Fundal Height: 28 cm Movement: Present     General:  Alert, oriented and cooperative. Patient is in no acute distress.  Skin: Skin is warm and dry. No rash noted.   Cardiovascular: Normal heart rate noted  Respiratory: Normal respiratory effort, no problems with respiration noted  Abdomen: Soft, gravid, appropriate for gestational age.  Pain/Pressure: Present     Pelvic: Cervical exam deferred        Extremities: Normal range of motion.  Edema: None  Mental Status: Normal mood and affect. Normal behavior. Normal judgment and thought content.   Assessment and Plan:  Pregnancy: [id number] at [redacted]w[redacted]d  1. Encounter for supervision of low-risk pregnancy in third trimester - Glucose Tolerance, 2 Hours w/1 Hour - CBC - RPR - HIV Antibody (routine testing w rflx) - reviewed risks of flu and Tdap, she declines - considering BTL - increase sleep and hydration for headaches  2. History of gestational diabetes Diet controlled  3. [redacted] weeks gestation of pregnancy    Preterm labor symptoms and  general obstetric precautions including but not limited to vaginal bleeding, contractions, leaking of fluid and fetal movement were reviewed in detail with the patient. Please refer to After Visit Summary for other counseling recommendations.   Return in about 2 weeks (around 06/04/2021) for high OB, in person.  No future appointments.  Conan Bowens, MD

## 2021-05-22 LAB — CBC
Hematocrit: 33 % — ABNORMAL LOW (ref 34.0–46.6)
Hemoglobin: 10.9 g/dL — ABNORMAL LOW (ref 11.1–15.9)
MCH: 27.9 pg (ref 26.6–33.0)
MCHC: 33 g/dL (ref 31.5–35.7)
MCV: 84 fL (ref 79–97)
Platelets: 178 10*3/uL (ref 150–450)
RBC: 3.91 x10E6/uL (ref 3.77–5.28)
RDW: 11.9 % (ref 11.7–15.4)
WBC: 6.5 10*3/uL (ref 3.4–10.8)

## 2021-05-22 LAB — GLUCOSE TOLERANCE, 2 HOURS W/ 1HR
Glucose, 1 hour: 119 mg/dL (ref 70–179)
Glucose, 2 hour: 115 mg/dL (ref 70–152)
Glucose, Fasting: 82 mg/dL (ref 70–91)

## 2021-05-22 LAB — RPR: RPR Ser Ql: NONREACTIVE

## 2021-05-22 LAB — HIV ANTIBODY (ROUTINE TESTING W REFLEX): HIV Screen 4th Generation wRfx: NONREACTIVE

## 2021-06-04 ENCOUNTER — Other Ambulatory Visit: Payer: Self-pay

## 2021-06-04 ENCOUNTER — Ambulatory Visit (INDEPENDENT_AMBULATORY_CARE_PROVIDER_SITE_OTHER): Payer: BC Managed Care – PPO | Admitting: Family Medicine

## 2021-06-04 VITALS — BP 109/68 | HR 99 | Wt 179.0 lb

## 2021-06-04 DIAGNOSIS — Z3A29 29 weeks gestation of pregnancy: Secondary | ICD-10-CM

## 2021-06-04 DIAGNOSIS — D563 Thalassemia minor: Secondary | ICD-10-CM

## 2021-06-04 DIAGNOSIS — Z8632 Personal history of gestational diabetes: Secondary | ICD-10-CM

## 2021-06-04 DIAGNOSIS — Z3493 Encounter for supervision of normal pregnancy, unspecified, third trimester: Secondary | ICD-10-CM

## 2021-06-04 DIAGNOSIS — Z3009 Encounter for other general counseling and advice on contraception: Secondary | ICD-10-CM

## 2021-06-04 NOTE — Progress Notes (Signed)
    Subjective:  Sherri Schmidt is a 30 y.o. [phone] at [redacted]w[redacted]d being seen today for ongoing prenatal care.  She is currently monitored for the following issues for this high-risk pregnancy and has Alpha thalassemia silent carrier; History of gestational diabetes; Breast lesion; Supervision of low-risk pregnancy; and Headache in pregnancy, antepartum, second trimester on their problem list.  Patient reports headaches intermittently while at work, resolves on own. Thinks its related to not enough fluid intake while at works  Contractions: Irritability. Vag. Bleeding: None.  Movement: Present. Denies leaking of fluid.   The following portions of the patient's history were reviewed and updated as appropriate: allergies, current medications, past family history, past medical history, past social history, past surgical history and problem list. Problem list updated.  Objective:   Vitals:   06/04/21 1344  BP: 109/68  Pulse: 99  Weight: 179 lb (81.2 kg)    Fetal Status: Fetal Heart Rate (bpm): 151   Movement: Present     General:  Alert, oriented and cooperative. Patient is in no acute distress.  Skin: Skin is warm and dry. No rash noted.   Cardiovascular: Normal heart rate noted  Respiratory: Normal respiratory effort, no problems with respiration noted  Abdomen: Soft, gravid, appropriate for gestational age. Pain/Pressure: Present     Pelvic: Vag. Bleeding: None     Cervical exam deferred        Extremities: Normal range of motion.  Edema: None  Mental Status: Normal mood and affect. Normal behavior. Normal judgment and thought content.   Urinalysis:      Assessment and Plan:  Pregnancy: [id number] at [redacted]w[redacted]d  1. Encounter for supervision of low-risk pregnancy in third trimester 2.  [redacted] weeks gestation of pregnancy Doing well. Some minor headaches while at work in setting of low fluid intake during shifts. Discussed increasing fluid intake. Also discussed blood pressure precautions. Fundal  height and heart tones appropriate - follow up in 2 weeks  3. Encounter for counseling regarding contraception Discussed contraception options. Has used Depo before. Thinks might want depo again. Informed patient that she can get depo prior to DC in post partum if she desires.   4. 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 Requests letter for light duty/breaks if needed. - letter written  Preterm labor symptoms and general obstetric precautions including but not limited to vaginal bleeding, contractions, leaking of fluid and fetal movement were reviewed in detail with the patient. Please refer to After Visit Summary for other counseling recommendations.  Return in about 2 weeks (around 06/18/2021) for HROB, any MD/DO.  Warner Mccreedy, MD, MPH OB Fellow, Faculty Practice

## 2021-06-06 DIAGNOSIS — Z419 Encounter for procedure for purposes other than remedying health state, unspecified: Secondary | ICD-10-CM | POA: Diagnosis not present

## 2021-06-14 ENCOUNTER — Other Ambulatory Visit: Payer: Self-pay

## 2021-06-14 ENCOUNTER — Ambulatory Visit (INDEPENDENT_AMBULATORY_CARE_PROVIDER_SITE_OTHER): Payer: BC Managed Care – PPO | Admitting: Obstetrics and Gynecology

## 2021-06-14 ENCOUNTER — Encounter: Payer: BC Managed Care – PPO | Admitting: Obstetrics and Gynecology

## 2021-06-14 VITALS — BP 104/68 | HR 76 | Wt 180.0 lb

## 2021-06-14 DIAGNOSIS — Z8632 Personal history of gestational diabetes: Secondary | ICD-10-CM

## 2021-06-14 DIAGNOSIS — Z3493 Encounter for supervision of normal pregnancy, unspecified, third trimester: Secondary | ICD-10-CM

## 2021-06-14 DIAGNOSIS — Z3A31 31 weeks gestation of pregnancy: Secondary | ICD-10-CM

## 2021-06-14 DIAGNOSIS — D563 Thalassemia minor: Secondary | ICD-10-CM

## 2021-06-14 NOTE — Progress Notes (Signed)
   PRENATAL VISIT NOTE  Subjective:  Sherri Schmidt is a 30 y.o. [phone] at [redacted]w[redacted]d being seen today for ongoing prenatal care.  She is currently monitored for the following issues for this low-risk pregnancy and has Alpha thalassemia silent carrier; History of gestational diabetes; Breast lesion; Supervision of low-risk pregnancy; Headache in pregnancy, antepartum, second trimester; and [redacted] weeks gestation of pregnancy on their problem list.  Patient doing well with no acute concerns today. She reports no complaints.  Contractions: Not present. Vag. Bleeding: None.  Movement: Present. Denies leaking of fluid.   The following portions of the patient's history were reviewed and updated as appropriate: allergies, current medications, past family history, past medical history, past social history, past surgical history and problem list. Problem list updated.  Objective:   Vitals:   06/14/21 0852  BP: 104/68  Pulse: 76  Weight: 180 lb (81.6 kg)    Fetal Status: Fetal Heart Rate (bpm): 143 Fundal Height: 31 cm Movement: Present     General:  Alert, oriented and cooperative. Patient is in no acute distress.  Skin: Skin is warm and dry. No rash noted.   Cardiovascular: Normal heart rate noted  Respiratory: Normal respiratory effort, no problems with respiration noted  Abdomen: Soft, gravid, appropriate for gestational age.  Pain/Pressure: Absent     Pelvic: Cervical exam deferred        Extremities: Normal range of motion.  Edema: None  Mental Status:  Normal mood and affect. Normal behavior. Normal judgment and thought content.   Assessment and Plan:  Pregnancy: [id number] at [redacted]w[redacted]d  1. Encounter for supervision of low-risk pregnancy in third trimester Continue routine care  2. [redacted] weeks gestation of pregnancy   3. History of gestational diabetes Pt passed 2 hour GTT  4. Alpha thalassemia silent carrier   Preterm labor symptoms and general obstetric precautions including but not  limited to vaginal bleeding, contractions, leaking of fluid and fetal movement were reviewed in detail with the patient.  Please refer to After Visit Summary for other counseling recommendations.   Return in about 2 weeks (around 06/28/2021) for LOB, in person.   Mariel Aloe, MD Faculty Attending [facility]

## 2021-06-19 ENCOUNTER — Encounter: Payer: Self-pay | Admitting: Obstetrics

## 2021-06-19 ENCOUNTER — Encounter: Payer: BC Managed Care – PPO | Admitting: Obstetrics and Gynecology

## 2021-06-28 ENCOUNTER — Encounter: Payer: BC Managed Care – PPO | Admitting: Obstetrics & Gynecology

## 2021-07-07 DIAGNOSIS — Z419 Encounter for procedure for purposes other than remedying health state, unspecified: Secondary | ICD-10-CM | POA: Diagnosis not present

## 2021-07-07 NOTE — L&D Delivery Note (Signed)
Delivery Note Pt labored down x 2 hours, had occasional decels but otherwise reactive FHR. Pushed x 3 contractions. At 12:38 PM a viable, healthy, and vigorous  female was delivered via Vaginal, Spontaneous (Presentation: Right Occiput Posterior).  APGAR: 8, 9; Shoulders delivered easily without any traction. Infant placed skin-to-skin w/ mom. Delayed cord clamping x 1 minute. Cord clamped x 2 and cut by support person. Weight pending.   Placenta status: Spontaneous, Intact.  Cord: 3 vessels with the following complications: dark, adherent clot at margin of placenta C/W possible marginal abruption.  Cord pH: NA  Anesthesia: Epidural Episiotomy: None Lacerations: None Suture Repair:  NA Est. Blood Loss (mL): 475 ml    Mom to postpartum.  Baby to Couplet care / Skin to Skin. Placenta to: LD Feeding: Bottle Circ: IP Contraception: IP Depo  [place] 08/11/2021, 1:02 PM

## 2021-07-24 ENCOUNTER — Ambulatory Visit (INDEPENDENT_AMBULATORY_CARE_PROVIDER_SITE_OTHER): Payer: BC Managed Care – PPO | Admitting: Family Medicine

## 2021-07-24 ENCOUNTER — Other Ambulatory Visit: Payer: Self-pay

## 2021-07-24 ENCOUNTER — Encounter: Payer: Self-pay | Admitting: Family Medicine

## 2021-07-24 ENCOUNTER — Other Ambulatory Visit (HOSPITAL_COMMUNITY)
Admission: RE | Admit: 2021-07-24 | Discharge: 2021-07-24 | Disposition: A | Discharge status: Home / Self Care | Payer: BC Managed Care – PPO | Source: Ambulatory Visit | Attending: Obstetrics & Gynecology | Admitting: Obstetrics & Gynecology

## 2021-07-24 VITALS — BP 116/73 | HR 88 | Wt 183.0 lb

## 2021-07-24 DIAGNOSIS — Z3490 Encounter for supervision of normal pregnancy, unspecified, unspecified trimester: Secondary | ICD-10-CM | POA: Diagnosis not present

## 2021-07-24 DIAGNOSIS — Z3A36 36 weeks gestation of pregnancy: Secondary | ICD-10-CM | POA: Insufficient documentation

## 2021-07-24 LAB — OB RESULTS CONSOLE GC/CHLAMYDIA: Gonorrhea: NEGATIVE

## 2021-07-24 NOTE — Progress Notes (Signed)
° °  PRENATAL VISIT NOTE  Subjective:  Sherri Schmidt is a 31 y.o. [phone] at [redacted]w[redacted]d being seen today for ongoing prenatal care.  She is currently monitored for the following issues for this low-risk pregnancy and has Alpha thalassemia silent carrier; History of gestational diabetes; Breast lesion; Supervision of low-risk pregnancy; and Headache in pregnancy, antepartum, second trimester on their problem list.  Patient reports no complaints.  Contractions: Irritability. Vag. Bleeding: None.  Movement: Present. Denies leaking of fluid.   The following portions of the patient's history were reviewed and updated as appropriate: allergies, current medications, past family history, past medical history, past social history, past surgical history and problem list.   Objective:   Vitals:   07/24/21 1442  BP: 116/73  Pulse: 88  Weight: 183 lb (83 kg)    Fetal Status: Fetal Heart Rate (bpm): 154 Fundal Height: 37 cm Movement: Present  General:  Alert, oriented and cooperative. Patient is in no acute distress.  Skin: Skin is warm and dry. No rash noted.   Cardiovascular: Normal heart rate noted.  Respiratory: Normal respiratory effort, no problems with respiration noted.  Abdomen: Soft, gravid, appropriate for gestational age.       Pelvic: Cervical exam deferred. Normal external genitalia, swabs obtained. Chaperone present.   Extremities: Normal range of motion.  Edema: None.  Mental Status: Normal mood and affect. Normal behavior. Normal judgment and thought content.   Assessment and Plan:  Pregnancy: [id number] at [redacted]w[redacted]d  1. Encounter for supervision of low-risk pregnancy, antepartum 2. [redacted] weeks gestation of pregnancy Progressing well. FH and FHT within normal limits. No concerns today. GBS and vaginal swabs collected, will follow up results. Return for next OB visit in 1 week. - Cervicovaginal ancillary only( ) - Culture, beta strep (group b only)  Preterm labor symptoms and  general obstetric precautions including but not limited to vaginal bleeding, contractions, leaking of fluid and fetal movement were reviewed in detail with the patient.  Please refer to After Visit Summary for other counseling recommendations.   Return in about 1 week (around 07/31/2021) for next OB visit.  Future Appointments  Date Time Provider Department Center  07/31/2021  1:50 PM Brock Bad, MD [facility] None  08/07/2021  4:10 PM Gerrit Heck, CNM [facility] None   Worthy Rancher, MD

## 2021-07-24 NOTE — Progress Notes (Signed)
ROB [facility] 

## 2021-07-25 LAB — CERVICOVAGINAL ANCILLARY ONLY
Chlamydia: NEGATIVE
Comment: NEGATIVE
Comment: NEGATIVE
Comment: NORMAL
Neisseria Gonorrhea: NEGATIVE
Trichomonas: NEGATIVE

## 2021-07-27 LAB — CULTURE, BETA STREP (GROUP B ONLY): Strep Gp B Culture: POSITIVE — AB

## 2021-07-31 ENCOUNTER — Other Ambulatory Visit: Payer: Self-pay

## 2021-07-31 ENCOUNTER — Encounter: Payer: Self-pay | Admitting: Obstetrics

## 2021-07-31 ENCOUNTER — Ambulatory Visit (INDEPENDENT_AMBULATORY_CARE_PROVIDER_SITE_OTHER): Payer: BC Managed Care – PPO | Admitting: Obstetrics

## 2021-07-31 VITALS — BP 113/71 | HR 81 | Wt 183.0 lb

## 2021-07-31 DIAGNOSIS — Z3490 Encounter for supervision of normal pregnancy, unspecified, unspecified trimester: Secondary | ICD-10-CM

## 2021-07-31 NOTE — Progress Notes (Signed)
Subjective:  Sherri Schmidt is a 31 y.o. [phone] at [redacted]w[redacted]d being seen today for ongoing prenatal care.  She is currently monitored for the following issues for this low-risk pregnancy and has Alpha thalassemia silent carrier; History of gestational diabetes; Breast lesion; Supervision of low-risk pregnancy; and Headache in pregnancy, antepartum, second trimester on their problem list.  Patient reports no complaints.  Contractions: Irritability. Vag. Bleeding: None.  Movement: Present. Denies leaking of fluid.   The following portions of the patient's history were reviewed and updated as appropriate: allergies, current medications, past family history, past medical history, past social history, past surgical history and problem list. Problem list updated.  Objective:   Vitals:   07/31/21 1407  BP: 113/71  Pulse: 81  Weight: 183 lb (83 kg)    Fetal Status: Fetal Heart Rate (bpm): 148   Movement: Present     General:  Alert, oriented and cooperative. Patient is in no acute distress.  Skin: Skin is warm and dry. No rash noted.   Cardiovascular: Normal heart rate noted  Respiratory: Normal respiratory effort, no problems with respiration noted  Abdomen: Soft, gravid, appropriate for gestational age. Pain/Pressure: Absent     Pelvic:  Cervical exam deferred        Extremities: Normal range of motion.  Edema: None  Mental Status: Normal mood and affect. Normal behavior. Normal judgment and thought content.   Urinalysis:      Assessment and Plan:  Pregnancy: [id number] at [redacted]w[redacted]d  1. Encounter for supervision of low-risk pregnancy, antepartum   Term labor symptoms and general obstetric precautions including but not limited to vaginal bleeding, contractions, leaking of fluid and fetal movement were reviewed in detail with the patient. Please refer to After Visit Summary for other counseling recommendations.   Return in about 1 week (around 08/07/2021) for ROB.   Brock Bad, MD   07/31/21

## 2021-08-07 ENCOUNTER — Ambulatory Visit (INDEPENDENT_AMBULATORY_CARE_PROVIDER_SITE_OTHER): Payer: BC Managed Care – PPO

## 2021-08-07 ENCOUNTER — Other Ambulatory Visit: Payer: Self-pay

## 2021-08-07 ENCOUNTER — Other Ambulatory Visit: Payer: Self-pay | Admitting: Advanced Practice Midwife

## 2021-08-07 VITALS — BP 124/80 | HR 77 | Wt 183.6 lb

## 2021-08-07 DIAGNOSIS — Z3A39 39 weeks gestation of pregnancy: Secondary | ICD-10-CM

## 2021-08-07 DIAGNOSIS — Z3493 Encounter for supervision of normal pregnancy, unspecified, third trimester: Secondary | ICD-10-CM

## 2021-08-07 DIAGNOSIS — Z419 Encounter for procedure for purposes other than remedying health state, unspecified: Secondary | ICD-10-CM | POA: Diagnosis not present

## 2021-08-07 DIAGNOSIS — Z3A38 38 weeks gestation of pregnancy: Secondary | ICD-10-CM

## 2021-08-07 NOTE — Progress Notes (Signed)
Pt presents for ROB no concerns today per pt.

## 2021-08-07 NOTE — Progress Notes (Signed)
° °  LOW-RISK PREGNANCY OFFICE VISIT  Patient name: Sherri Schmidt MRN [id number]  Date of birth: 02/23/1991 Chief Complaint:   Routine Prenatal Visit  Subjective:   Sherri Schmidt is a 31 y.o. [id number] female at [redacted]w[redacted]d with an Estimated Date of Delivery: 08/15/21 being seen today for ongoing management of a low-risk pregnancy aeb has Alpha thalassemia silent carrier; History of gestational diabetes; Breast lesion; Supervision of low-risk pregnancy; and Headache in pregnancy, antepartum, second trimester on their problem list.  Patient presents today with  pelvic pain .  She contributes this discomfort to fetal position and also reports right sided pain that resolves with position change.   Patient endorses fetal movement. Patient endorses occasional abdominal cramping and contractions.  Patient denies vaginal concerns including abnormal discharge, leaking of fluid, and bleeding.    Contractions: Irritability. Vag. Bleeding: None.  Movement: Present.  Reviewed past medical,surgical, social, obstetrical and family history as well as problem list, medications and allergies.  Objective   Vitals:   08/07/21 1614  BP: 124/80  Pulse: 77  Weight: 183 lb 9.6 oz (83.3 kg)  Body mass index is 34.69 kg/m.  Total Weight Gain:13 lb 9.6 oz (6.169 kg)         Physical Examination:   General appearance: Well appearing, and in no distress  Mental status: Alert, oriented to person, place, and time  Skin: Warm & dry  Cardiovascular: Normal heart rate noted  Respiratory: Normal respiratory effort, no distress  Abdomen: Soft, gravid, nontender, AGA with Fundal Height: 38 cm  Pelvic: Cervical exam deferred           Extremities: Edema: None  Fetal Status: Fetal Heart Rate (bpm): 148  Movement: Present   No results found for this or any previous visit (from the past 24 hour(s)).  Assessment & Plan:  Low-risk pregnancy of a 31 y.o., [id number] at [redacted]w[redacted]d with an Estimated Date of Delivery: 08/15/21   1.  Encounter for supervision of low-risk pregnancy in third trimester -Anticipatory guidance for upcoming appts. -Patient to schedule next appt in 1 weeks for an in-person visit.   2. [redacted] weeks gestation of pregnancy -Doing well overall. -Discussed eIOL at 40 weeks. -Patient agreeable, but states she needs to be scheduled on a weekend or she want have any support.  -Request made for Saturday [facility]th at [facility].  -Admit and IOL orders placed.     Meds: No orders of the defined types were placed in this encounter.  Labs/procedures today:  Lab Orders  No laboratory test(s) ordered today     Reviewed: Term labor symptoms and general obstetric precautions including but not limited to vaginal bleeding, contractions, leaking of fluid and fetal movement were reviewed in detail with the patient.  All questions were answered.  Follow-up: Return in about 1 week (around 08/14/2021).  No orders of the defined types were placed in this encounter.  Cherre Robins MSN, CNM 08/07/2021

## 2021-08-08 ENCOUNTER — Encounter (HOSPITAL_COMMUNITY): Payer: Self-pay | Admitting: *Deleted

## 2021-08-08 ENCOUNTER — Telehealth (HOSPITAL_COMMUNITY): Payer: Self-pay | Admitting: *Deleted

## 2021-08-08 ENCOUNTER — Encounter (HOSPITAL_COMMUNITY): Payer: Self-pay

## 2021-08-08 NOTE — Telephone Encounter (Signed)
Preadmission screen  

## 2021-08-11 ENCOUNTER — Inpatient Hospital Stay (HOSPITAL_COMMUNITY): Payer: BC Managed Care – PPO | Admitting: Anesthesiology

## 2021-08-11 ENCOUNTER — Inpatient Hospital Stay (HOSPITAL_COMMUNITY)
Admission: AD | Admit: 2021-08-11 | Discharge: 2021-08-12 | DRG: 807 | Disposition: A | Discharge status: Home / Self Care | Payer: BC Managed Care – PPO | Attending: Obstetrics and Gynecology | Admitting: Obstetrics and Gynecology

## 2021-08-11 ENCOUNTER — Encounter (HOSPITAL_COMMUNITY): Payer: Self-pay | Admitting: Obstetrics & Gynecology

## 2021-08-11 ENCOUNTER — Other Ambulatory Visit: Payer: Self-pay

## 2021-08-11 DIAGNOSIS — Z20822 Contact with and (suspected) exposure to covid-19: Secondary | ICD-10-CM | POA: Diagnosis present

## 2021-08-11 DIAGNOSIS — D563 Thalassemia minor: Secondary | ICD-10-CM | POA: Diagnosis present

## 2021-08-11 DIAGNOSIS — O99820 Streptococcus B carrier state complicating pregnancy: Secondary | ICD-10-CM | POA: Diagnosis not present

## 2021-08-11 DIAGNOSIS — Z3A39 39 weeks gestation of pregnancy: Secondary | ICD-10-CM | POA: Diagnosis not present

## 2021-08-11 DIAGNOSIS — R030 Elevated blood-pressure reading, without diagnosis of hypertension: Secondary | ICD-10-CM | POA: Diagnosis not present

## 2021-08-11 DIAGNOSIS — O99893 Other specified diseases and conditions complicating puerperium: Secondary | ICD-10-CM | POA: Diagnosis not present

## 2021-08-11 DIAGNOSIS — O4593 Premature separation of placenta, unspecified, third trimester: Secondary | ICD-10-CM | POA: Diagnosis not present

## 2021-08-11 DIAGNOSIS — O99824 Streptococcus B carrier state complicating childbirth: Secondary | ICD-10-CM | POA: Diagnosis present

## 2021-08-11 DIAGNOSIS — O26893 Other specified pregnancy related conditions, third trimester: Secondary | ICD-10-CM | POA: Diagnosis present

## 2021-08-11 DIAGNOSIS — Z3493 Encounter for supervision of normal pregnancy, unspecified, third trimester: Secondary | ICD-10-CM | POA: Diagnosis not present

## 2021-08-11 DIAGNOSIS — Z3490 Encounter for supervision of normal pregnancy, unspecified, unspecified trimester: Secondary | ICD-10-CM

## 2021-08-11 LAB — COMPREHENSIVE METABOLIC PANEL
ALT: 14 U/L (ref 0–44)
AST: 23 U/L (ref 15–41)
Albumin: 2.2 g/dL — ABNORMAL LOW (ref 3.5–5.0)
Alkaline Phosphatase: 130 U/L — ABNORMAL HIGH (ref 38–126)
Anion gap: 10 (ref 5–15)
BUN: 7 mg/dL (ref 6–20)
CO2: 19 mmol/L — ABNORMAL LOW (ref 22–32)
Calcium: 8.2 mg/dL — ABNORMAL LOW (ref 8.9–10.3)
Chloride: 106 mmol/L (ref 98–111)
Creatinine, Ser: 0.86 mg/dL (ref 0.44–1.00)
GFR, Estimated: >60 mL/min (ref >60)
Glucose, Bld: 141 mg/dL — ABNORMAL HIGH (ref 70–99)
Potassium: 3.8 mmol/L (ref 3.5–5.1)
Sodium: 135 mmol/L (ref 135–145)
Total Bilirubin: 0.4 mg/dL (ref 0.3–1.2)
Total Protein: 5.3 g/dL — ABNORMAL LOW (ref 6.5–8.1)

## 2021-08-11 LAB — RPR: RPR Ser Ql: NONREACTIVE

## 2021-08-11 LAB — CBC
HCT: 37.3 % (ref 36.0–46.0)
HCT: 34 % — ABNORMAL LOW (ref 36.0–46.0)
Hemoglobin: 12.1 g/dL (ref 12.0–15.0)
Hemoglobin: 10.9 g/dL — ABNORMAL LOW (ref 12.0–15.0)
MCH: 28.6 pg (ref 26.0–34.0)
MCH: 28.5 pg (ref 26.0–34.0)
MCHC: 32.4 g/dL (ref 30.0–36.0)
MCHC: 32.1 g/dL (ref 30.0–36.0)
MCV: 88.2 fL (ref 80.0–100.0)
MCV: 88.8 fL (ref 80.0–100.0)
Platelets: 174 10*3/uL (ref 150–400)
Platelets: 172 10*3/uL (ref 150–400)
RBC: 4.23 MIL/uL (ref 3.87–5.11)
RBC: 3.83 MIL/uL — ABNORMAL LOW (ref 3.87–5.11)
RDW: 13.4 % (ref 11.5–15.5)
RDW: 13.6 % (ref 11.5–15.5)
WBC: 6.2 10*3/uL (ref 4.0–10.5)
WBC: 8.9 10*3/uL (ref 4.0–10.5)
nRBC: 0 % (ref 0.0–0.2)
nRBC: 0 % (ref 0.0–0.2)

## 2021-08-11 LAB — TYPE AND SCREEN
ABO/RH(D): A POS
Antibody Screen: NEGATIVE

## 2021-08-11 LAB — RESP PANEL BY RT-PCR (FLU A&B, COVID) ARPGX2
Influenza A by PCR: NEGATIVE
Influenza B by PCR: NEGATIVE
SARS Coronavirus 2 by RT PCR: NEGATIVE

## 2021-08-11 MED ORDER — MEDROXYPROGESTERONE ACETATE 150 MG/ML IM SUSP: 150.0000 mg | Freq: Once | INTRAMUSCULAR | Status: DC

## 2021-08-11 MED ORDER — WITCH HAZEL-GLYCERIN EX PADS: 1.0000 "application " | MEDICATED_PAD | CUTANEOUS | Status: DC | PRN

## 2021-08-11 MED ORDER — FENTANYL-BUPIVACAINE-NACL 0.5-0.125-0.9 MG/250ML-% EP SOLN
12.0000 mL/h | EPIDURAL | Status: DC | PRN
  Administered 2021-08-11: 12 mL/h via EPIDURAL
  Filled 2021-08-11: qty 250

## 2021-08-11 MED ORDER — EPHEDRINE 5 MG/ML INJ: 10.0000 mg | INTRAVENOUS | Status: DC | PRN

## 2021-08-11 MED ORDER — ACETAMINOPHEN 325 MG PO TABS
650.0000 mg | ORAL_TABLET | ORAL | Status: DC | PRN
  Administered 2021-08-11: 650 mg via ORAL
  Filled 2021-08-11: qty 2

## 2021-08-11 MED ORDER — LACTATED RINGERS IV SOLN: 500.0000 mL | INTRAVENOUS | Status: DC | PRN

## 2021-08-11 MED ORDER — OXYTOCIN-SODIUM CHLORIDE 30-0.9 UT/500ML-% IV SOLN
2.5000 [IU]/h | INTRAVENOUS | Status: DC
  Administered 2021-08-11: 2.5 [IU]/h via INTRAVENOUS
  Filled 2021-08-11: qty 500

## 2021-08-11 MED ORDER — DIPHENHYDRAMINE HCL 25 MG PO CAPS: 25.0000 mg | ORAL_CAPSULE | Freq: Four times a day (QID) | ORAL | Status: DC | PRN

## 2021-08-11 MED ORDER — BENZOCAINE-MENTHOL 20-0.5 % EX AERO: 1.0000 "application " | INHALATION_SPRAY | CUTANEOUS | Status: DC | PRN

## 2021-08-11 MED ORDER — LIDOCAINE-EPINEPHRINE (PF) 2 %-1:200000 IJ SOLN
INTRAMUSCULAR | Status: AC
  Filled 2021-08-11: qty 20

## 2021-08-11 MED ORDER — DIPHENHYDRAMINE HCL 50 MG/ML IJ SOLN: 12.5000 mg | INTRAMUSCULAR | Status: DC | PRN

## 2021-08-11 MED ORDER — LIDOCAINE HCL (PF) 1 % IJ SOLN: 30.0000 mL | INTRAMUSCULAR | Status: DC | PRN

## 2021-08-11 MED ORDER — OXYTOCIN-SODIUM CHLORIDE 30-0.9 UT/500ML-% IV SOLN: 1.0000 m[IU]/min | INTRAVENOUS | Status: DC

## 2021-08-11 MED ORDER — SODIUM CHLORIDE 0.9 % IV SOLN
2.0000 g | Freq: Once | INTRAVENOUS | Status: AC
  Administered 2021-08-11: 2 g via INTRAVENOUS
  Filled 2021-08-11: qty 2000

## 2021-08-11 MED ORDER — LIDOCAINE HCL (PF) 1 % IJ SOLN
INTRAMUSCULAR | Status: DC | PRN
  Administered 2021-08-11: 6 mL via EPIDURAL

## 2021-08-11 MED ORDER — PRENATAL MULTIVITAMIN CH
1.0000 | ORAL_TABLET | Freq: Every day | ORAL | Status: DC
  Administered 2021-08-12: 1 via ORAL
  Filled 2021-08-11: qty 1

## 2021-08-11 MED ORDER — ACETAMINOPHEN 325 MG PO TABS: 650.0000 mg | ORAL_TABLET | ORAL | Status: DC | PRN

## 2021-08-11 MED ORDER — COCONUT OIL OIL: 1.0000 "application " | TOPICAL_OIL | Status: DC | PRN

## 2021-08-11 MED ORDER — SIMETHICONE 80 MG PO CHEW: 80.0000 mg | CHEWABLE_TABLET | ORAL | Status: DC | PRN

## 2021-08-11 MED ORDER — FUROSEMIDE 20 MG PO TABS
20.0000 mg | ORAL_TABLET | Freq: Every day | ORAL | Status: DC
  Administered 2021-08-11 – 2021-08-12 (×2): 20 mg via ORAL
  Filled 2021-08-11 (×2): qty 1

## 2021-08-11 MED ORDER — IBUPROFEN 600 MG PO TABS
600.0000 mg | ORAL_TABLET | Freq: Four times a day (QID) | ORAL | Status: DC
  Administered 2021-08-11 – 2021-08-12 (×4): 600 mg via ORAL
  Filled 2021-08-11 (×4): qty 1

## 2021-08-11 MED ORDER — SODIUM CHLORIDE 0.9 % IV SOLN
1.0000 g | INTRAVENOUS | Status: DC
  Administered 2021-08-11 (×2): 1 g via INTRAVENOUS
  Filled 2021-08-11 (×2): qty 1000

## 2021-08-11 MED ORDER — OXYTOCIN BOLUS FROM INFUSION
333.0000 mL | Freq: Once | INTRAVENOUS | Status: AC
  Administered 2021-08-11: 333 mL via INTRAVENOUS

## 2021-08-11 MED ORDER — ONDANSETRON HCL 4 MG/2ML IJ SOLN: 4.0000 mg | INTRAMUSCULAR | Status: DC | PRN

## 2021-08-11 MED ORDER — PHENYLEPHRINE 40 MCG/ML (10ML) SYRINGE FOR IV PUSH (FOR BLOOD PRESSURE SUPPORT)
80.0000 ug | PREFILLED_SYRINGE | INTRAVENOUS | Status: DC | PRN
  Filled 2021-08-11: qty 10

## 2021-08-11 MED ORDER — FENTANYL CITRATE (PF) 100 MCG/2ML IJ SOLN: 50.0000 ug | INTRAMUSCULAR | Status: DC | PRN

## 2021-08-11 MED ORDER — LACTATED RINGERS IV SOLN: 500.0000 mL | Freq: Once | INTRAVENOUS | Status: DC

## 2021-08-11 MED ORDER — PHENYLEPHRINE 40 MCG/ML (10ML) SYRINGE FOR IV PUSH (FOR BLOOD PRESSURE SUPPORT)
80.0000 ug | PREFILLED_SYRINGE | INTRAVENOUS | Status: DC | PRN
  Administered 2021-08-11 (×2): 80 ug via INTRAVENOUS

## 2021-08-11 MED ORDER — FUROSEMIDE 20 MG PO TABS
20.0000 mg | ORAL_TABLET | Freq: Two times a day (BID) | ORAL | Status: DC
Start: 2021-08-11 — End: 2021-08-11

## 2021-08-11 MED ORDER — DIBUCAINE (PERIANAL) 1 % EX OINT: 1.0000 "application " | TOPICAL_OINTMENT | CUTANEOUS | Status: DC | PRN

## 2021-08-11 MED ORDER — ONDANSETRON HCL 4 MG PO TABS: 4.0000 mg | ORAL_TABLET | ORAL | Status: DC | PRN

## 2021-08-11 MED ORDER — LACTATED RINGERS IV SOLN: INTRAVENOUS | Status: DC

## 2021-08-11 MED ORDER — MISOPROSTOL 25 MCG QUARTER TABLET: 25.0000 ug | ORAL_TABLET | ORAL | Status: DC | PRN

## 2021-08-11 MED ORDER — OXYCODONE-ACETAMINOPHEN 5-325 MG PO TABS: 1.0000 | ORAL_TABLET | ORAL | Status: DC | PRN

## 2021-08-11 MED ORDER — OXYCODONE-ACETAMINOPHEN 5-325 MG PO TABS: 2.0000 | ORAL_TABLET | ORAL | Status: DC | PRN

## 2021-08-11 MED ORDER — MEASLES, MUMPS & RUBELLA VAC IJ SOLR: 0.5000 mL | Freq: Once | INTRAMUSCULAR | Status: DC

## 2021-08-11 MED ORDER — TETANUS-DIPHTH-ACELL PERTUSSIS 5-2.5-18.5 LF-MCG/0.5 IM SUSY: 0.5000 mL | PREFILLED_SYRINGE | Freq: Once | INTRAMUSCULAR | Status: DC

## 2021-08-11 MED ORDER — SOD CITRATE-CITRIC ACID 500-334 MG/5ML PO SOLN: 30.0000 mL | ORAL | Status: DC | PRN

## 2021-08-11 MED ORDER — ONDANSETRON HCL 4 MG/2ML IJ SOLN: 4.0000 mg | Freq: Four times a day (QID) | INTRAMUSCULAR | Status: DC | PRN

## 2021-08-11 MED ORDER — TERBUTALINE SULFATE 1 MG/ML IJ SOLN
0.2500 mg | Freq: Once | INTRAMUSCULAR | Status: AC | PRN
  Administered 2021-08-11: 0.25 mg via SUBCUTANEOUS
  Filled 2021-08-11: qty 1

## 2021-08-11 MED ORDER — MAGNESIUM HYDROXIDE 400 MG/5ML PO SUSP: 30.0000 mL | ORAL | Status: DC | PRN

## 2021-08-11 NOTE — Progress Notes (Signed)
Notified Kooistra, CNM regarding patient trending over the acceptable range for Bps, but not severe. Asymptomatic. CNM will evaluate her case.

## 2021-08-11 NOTE — Discharge Summary (Signed)
Postpartum Discharge Summary     Patient Name: Sherri Schmidt DOB: 1990-09-25 MRN: [id number]  Date of admission: 08/11/2021 Delivery date:08/11/2021  Delivering provider: Manya Silvas  Date of discharge: 08/12/2021  Admitting diagnosis: Indication for care or intervention related to labor and delivery [O75.9] Intrauterine pregnancy: [redacted]w[redacted]d    Secondary diagnosis:  Principal Problem:   Vaginal delivery Active Problems:   Alpha thalassemia silent carrier   Supervision of low-risk pregnancy  Additional problems: Elevated blood pressures s/p delivery   Discharge diagnosis: Term Pregnancy Delivered                                              Post partum procedures: None Augmentation: AROM Complications: None  Hospital course: Onset of Labor With Vaginal Delivery      31y.o. yo [id number] admitted in Active Labor on 08/11/2021. Patient had an uncomplicated labor course as follows:  Membrane Rupture Time/Date: 4:46 AM ,08/11/2021   Delivery Method:Vaginal, Spontaneous  Episiotomy: None  Lacerations:  None  Patient had elevated blood pressures into the 140's/90s right after delivery. She was given Lasix with normotensive pressures the evening following. Her pre-E labs were normal. Patient continued to have some borderline blood pressures in the 130s/80s and then returned to normal range with a BP of 115/86 the morning of discharge. She had no signs/symptoms of pre-eclampsia during her admission. She will have a blood pressure check in one week to monitor this. She will continue Lasix upon discharge to complete a 5 day course. She was enrolled in [employer]. She is ambulating, tolerating a regular diet, passing flatus, and urinating well. Patient is discharged home in stable condition on 08/12/21.  Newborn Data: Birth date:08/11/2021  Birth time:12:38 PM  Gender:Female  Living status:Living  Apgars:8 ,9  Weight:3260 g   Magnesium Sulfate received: No BMZ received:  No Rhophylac: N/A MMR: N/A T-DaP: Declined Flu: Declined  Transfusion: No  Physical exam  Vitals:   08/11/21 2018 08/12/21 0010 08/12/21 0341 08/12/21 0845  BP: 119/70 138/85 133/79 115/86  Pulse: 73 62 74 72  Resp: '18 17 16   ' Temp: 99 F (37.2 C) 98.2 F (36.8 C) 98.5 F (36.9 C)   TempSrc:  Oral Oral   SpO2: 100%  100%   Weight:      Height:       General: alert, cooperative, and no distress Lochia: appropriate Uterine Fundus: firm and below umbilicus  DVT Evaluation: no LE edema or calf tenderness to palpation  Labs: Lab Results  Component Value Date   WBC 6.9 08/12/2021   HGB 9.8 (L) 08/12/2021   HCT 30.1 (L) 08/12/2021   MCV 89.1 08/12/2021   PLT 139 (L) 08/12/2021   CMP Latest Ref Rng & Units 08/11/2021  Glucose 70 - 99 mg/dL 141(H)  BUN 6 - 20 mg/dL 7  Creatinine 0.44 - 1.00 mg/dL 0.86  Sodium 135 - 145 mmol/L 135  Potassium 3.5 - 5.1 mmol/L 3.8  Chloride 98 - 111 mmol/L 106  CO2 22 - 32 mmol/L 19(L)  Calcium 8.9 - 10.3 mg/dL 8.2(L)  Total Protein 6.5 - 8.1 g/dL 5.3(L)  Total Bilirubin 0.3 - 1.2 mg/dL 0.4  Alkaline Phos 38 - 126 U/L 130(H)  AST 15 - 41 U/L 23  ALT 0 - 44 U/L 14   Edinburgh Score: EdFlavia Shipperostnatal  Depression Scale Screening Tool 08/11/2021  I have been able to laugh and see the funny side of things. 0  I have looked forward with enjoyment to things. 0  I have blamed myself unnecessarily when things went wrong. 1  I have been anxious or worried for no good reason. 0  I have felt scared or panicky for no good reason. 0  Things have been getting on top of me. 1  I have been so unhappy that I have had difficulty sleeping. 0  I have felt sad or miserable. 0  I have been so unhappy that I have been crying. 0  The thought of harming myself has occurred to me. 0  Edinburgh Postnatal Depression Scale Total 2     After visit meds:  Allergies as of 08/12/2021       Reactions   Other Rash   Chlorox products--rash        Medication  List     STOP taking these medications    Diclegis 10-10 MG Tbec Generic drug: Doxylamine-Pyridoxine       TAKE these medications    acetaminophen 500 MG tablet Commonly known as: TYLENOL Take 2 tablets (1,000 mg total) by mouth every 8 (eight) hours as needed (pain).   furosemide 20 MG tablet Commonly known as: LASIX Take 1 tablet (20 mg total) by mouth daily for 3 days.   ibuprofen 600 MG tablet Commonly known as: ADVIL Take 1 tablet (600 mg total) by mouth every 6 (six) hours as needed (pain).   PNV-DHA+Docusate 27-1.25-300 MG Caps Take 1 capsule by mouth daily before breakfast.         Discharge home in stable condition Infant Feeding: Bottle Infant Disposition: home with mother Discharge instruction: per After Visit Summary and Postpartum booklet. Activity: Advance as tolerated. Pelvic rest for 6 weeks.  Diet: routine diet Future Appointments: Future Appointments  Date Time Provider [place]  08/14/2021  9:35 AM Shelly Bombard, MD [place] None   Follow up Visit: Please schedule this patient for a Virtual or in-person postpartum visit in 4 weeks with the following provider: Any provider. Additional Postpartum F/U: BP check in one week; message sent by Dr. Gwenlyn Perking on 2/5. Low risk pregnancy complicated by:  None Delivery mode:  Vaginal, Spontaneous  Anticipated Birth Control:  Depo  08/12/2021 Genia Del, MD

## 2021-08-11 NOTE — Lactation Note (Signed)
This note was copied from a baby's chart. Lactation Consultation Note  Patient Name: Sherri Schmidt Date: 08/11/2021   Age:29 hours  LC spoke with RN taking care of couple and was told Mom's feeding choice is exclusive formula feeding.   Consult Status Consult Status: Complete    [name] 08/11/2021, 5:50 PM

## 2021-08-11 NOTE — Progress Notes (Signed)
Telephone call from RN about patient's blood pressure, which has been consistently elevated since transferring to PP. Patient is asymptomatic, but patient has had BPs 140/90s consistently. Will draw pre-e labs and re-assess.   Luna Kitchens

## 2021-08-11 NOTE — MAU Provider Note (Signed)
[id number] Sherri Schmidt [birth year]  Patient informed that the ultrasound is considered a limited OB ultrasound and is not intended to be a complete ultrasound exam.  Patient also informed that the ultrasound is not being completed with the intent of assessing for fetal or placental anomalies or any pelvic abnormalities.  Explained that the purpose of todays ultrasound is to assess for  presentation.  Patient acknowledges the purpose of the exam and the limitations of the study.  Vertex presentation noted.  Cherre Robins, CNM 08/11/2021, 1:17 AM

## 2021-08-11 NOTE — Progress Notes (Signed)
Sherri Schmidt is a 31 y.o. [id number] at [redacted]w[redacted]d.  Subjective: Comfortable w/ epidural. Denies pelvic pressure ore urge to push.   Objective: BP 133/77    Pulse 72    Temp 98.4 F (36.9 C) (Axillary)    Resp 16    Ht 5\' 1"  (1.549 m)    Wt 84.4 kg    LMP 10/20/2020    SpO2 100%    BMI 35.14 kg/m    FHT:  FHR: 155 bpm, variability: mod,  accelerations:  15x15,  decelerations:  None UC:   Q 1.5-3 minutes, mod strong Dilation: 10 Dilation Complete Date: 08/11/21 Dilation Complete Time: 0942 Effacement (%): 90 Station: Plus 1 Presentation:  (OT) Exam by:: Danyal Whitenack CNM  Labs: NA  Assessment / Plan: [redacted]w[redacted]d week IUP Labor: Start second stage. Tried pushing x 1 few contractions. Pt unable to feel anything. Baby not descending w/ pushing efforts. Feels OT and asynclitic. Will labor down and try exaggerated Sims to left. Baby had prolonged decel after position change that recovered. No decel w/ next contraction.  Fetal Wellbeing:  Category I-II, overall reassuring. Periods of decels particularly after position changes and post-epidural. WIll CTO closely. Attending aware.   Pain Control:  Epidural.  Anticipated MOD:  SVD  [redacted]w[redacted]d, Katrinka Blazing, CNM 08/11/2021 10:00 AM

## 2021-08-11 NOTE — Anesthesia Preprocedure Evaluation (Signed)
Anesthesia Evaluation  Patient identified by MRN, date of birth, ID band Patient awake    Reviewed: Allergy & Precautions, H&P , NPO status , Patient's Chart, lab work & pertinent test results  History of Anesthesia Complications Negative for: history of anesthetic complications  Airway Mallampati: II  TM Distance: >3 FB Neck ROM: full    Dental no notable dental hx.    Pulmonary neg pulmonary ROS,    Pulmonary exam normal        Cardiovascular negative cardio ROS Normal cardiovascular exam Rhythm:regular Rate:Normal     Neuro/Psych negative neurological ROS  negative psych ROS   GI/Hepatic negative GI ROS, Neg liver ROS,   Endo/Other  diabetes, Gestational  Renal/GU negative Renal ROS  negative genitourinary   Musculoskeletal   Abdominal   Peds  Hematology negative hematology ROS (+)   Anesthesia Other Findings   Reproductive/Obstetrics (+) Pregnancy                             Anesthesia Physical Anesthesia Plan  ASA: 2  Anesthesia Plan: Epidural   Post-op Pain Management:    Induction:   PONV Risk Score and Plan: 2 and Treatment may vary due to age or medical condition  Airway Management Planned: Natural Airway  Additional Equipment: None  Intra-op Plan:   Post-operative Plan:   Informed Consent: I have reviewed the patients History and Physical, chart, labs and discussed the procedure including the risks, benefits and alternatives for the proposed anesthesia with the patient or authorized representative who has indicated his/her understanding and acceptance.       Plan Discussed with: Anesthesiologist  Anesthesia Plan Comments:         Anesthesia Quick Evaluation

## 2021-08-11 NOTE — Progress Notes (Signed)
Sherri Schmidt is a 31 y.o. [phone] at [redacted]w[redacted]d  admitted for active labor  Called to bedside for deceleration. HR in 90s. Fluid bolus running. Position changed and HR coming up. Phenylephrine also given as hypotensive after epidural.   Cervix checked 8.5-9 cm with bulging bag. Received dose 1 of Amp. 2nd dose due at 0600.  HR back at baseline of 150s with minimal variability. No further decels. Patient also contracting every 1-2 min   Will continue expectant management at this time. If deceleration again can AROM and see if can push since multip. If having frequent contractions with next decel can give terbutaline   Variability improved to moderate 15-20 min after decel.  Warner Mccreedy, MD, MPH OB Fellow, Faculty Practice

## 2021-08-11 NOTE — Progress Notes (Signed)
Sherri Schmidt is a 31 y.o. [phone] at [redacted]w[redacted]d admitted for active labor  Subjective: Feeling comfortable. Resting while laying on right side.  Objective: BP (!) 108/58    Pulse 64    Temp 98.3 F (36.8 C) (Oral)    Resp 16    Ht 5\' 1"  (1.549 m)    Wt 84.4 kg    LMP 10/20/2020    SpO2 99%    BMI 35.14 kg/m  No intake/output data recorded. No intake/output data recorded.  FHT:  FHR: 150 bpm, variability: moderate,  accelerations:  Abscent,  decelerations:  Absent UC:   regular, every 2-4 minutes, coupling at times SVE:   Dilation: 7.5 Effacement (%): 90 Station: -1 Exam by:: Dr. [email or web address]  Labs: Lab Results  Component Value Date   WBC 6.2 08/11/2021   HGB 12.1 08/11/2021   HCT 37.3 08/11/2021   MCV 88.2 08/11/2021   PLT 174 08/11/2021    Assessment / Plan: 10/09/2021 at [redacted]w[redacted]d admitted for active labor  Labor:  patient being expectantly managed. Given tracing cat II at times with two prolonged decels in setting of hypotension as well as minimal variability AROMed during current check to augment labor. Patient and fetus tolerated procedure well. Station 0. Position changed to facilitate descent of fetal head.  Fetal Wellbeing:  Category II with two prior prolonged decel in setting of hypotension. Resolved with position changes and phenylephrine. Also with minimal variability after but improved.   After AROM having intermittent early decelerations.  Pain Control:  Epidural I/D:   GBS pos>Amp   [redacted]w[redacted]d 08/11/2021, 5:21 AM

## 2021-08-11 NOTE — MAU Note (Incomplete)
Pt reports ctx started about 1.5 hours ago. Becoming more painful. Denies any vag bleeding or leaking good fetal movememt

## 2021-08-11 NOTE — Anesthesia Procedure Notes (Signed)
Epidural Patient location during procedure: OB Start time: 08/11/2021 2:15 AM End time: 08/11/2021 2:25 AM  Staffing Anesthesiologist: Mellody Dance, MD Performed: anesthesiologist   Preanesthetic Checklist Completed: patient identified, IV checked, site marked, risks and benefits discussed, monitors and equipment checked, pre-op evaluation and timeout performed  Epidural Patient position: sitting Prep: DuraPrep Patient monitoring: heart rate, cardiac monitor, continuous pulse ox and blood pressure Approach: midline Location: L2-L3 Injection technique: LOR saline  Needle:  Needle type: Tuohy  Needle gauge: 17 G Needle length: 9 cm Needle insertion depth: 6 cm Catheter type: closed end flexible Catheter size: 20 Guage Catheter at skin depth: 11 cm Test dose: negative and Other  Assessment Events: blood not aspirated, injection not painful, no injection resistance and negative IV test  Additional Notes Informed consent obtained prior to proceeding including risk of failure, 1% risk of PDPH, risk of minor discomfort and bruising.  Discussed rare but serious complications including epidural abscess, permanent nerve injury, epidural hematoma.  Discussed alternatives to epidural analgesia and patient desires to proceed.  Timeout performed pre-procedure verifying patient name, procedure, and platelet count.  Patient tolerated procedure well.

## 2021-08-11 NOTE — H&P (Addendum)
OBSTETRIC ADMISSION HISTORY AND PHYSICAL  Sherri Schmidt is a 31 y.o. female [phone] with IUP at [redacted]w[redacted]d by early [place] presenting for active labor. She reports +FMs, No LOF, no VB, no blurry vision, headaches or peripheral edema, and RUQ pain.  She plans on breast feeding. She request depo for birth control. She received her prenatal care at  [facility]    Dating: By early [place] --->  Estimated Date of Delivery: 08/15/21  Sono:   @[redacted]w[redacted]d , CWD, normal anatomy,  breech presentation, anterior placental lie, 348 g, 74% EFW   Prenatal History/Complications:  Alpha thalassemia carrier GDM in prior pregnancy- normal GTT in this pregnancy  Past Medical History: Past Medical History:  Diagnosis Date   Gestational diabetes     Past Surgical History: Past Surgical History:  Procedure Laterality Date   NO PAST SURGERIES      Obstetrical History: OB History     Gravida  4   Para  2   Term  2   Preterm      AB  1   Living  2      SAB      IAB  1   Ectopic      Multiple  0   Live Births  2           Social History Social History   Socioeconomic History   Marital status: Single    Spouse name: Not on file   Number of children: Not on file   Years of education: Not on file   Highest education level: Not on file  Occupational History    Employer:  Tobacco Use   Smoking status: Never   Smokeless tobacco: Never  Vaping Use   Vaping Use: Never used  Substance and Sexual Activity   Alcohol use: No   Drug use: No   Sexual activity: Not Currently  Other Topics Concern   Not on file  Social History Narrative   Not on file   Social Determinants of Health   Financial Resource Strain: Not on file  Food Insecurity: Not on file  Transportation Needs: Not on file  Physical Activity: Not on file  Stress: Not on file  Social Connections: Not on file    Family History: Family History  Problem Relation Age of Onset   Hypertension Mother    Diabetes Mother      Allergies: Allergies  Allergen Reactions   Other Rash    Chlorox products--rash    Medications Prior to Admission  Medication Sig Dispense Refill Last Dose   DICLEGIS 10-10 MG TBEC Take 10 mg by mouth at bedtime.      Prenat-FeFum-DSS-FA-DHA w/o A (PNV-DHA+DOCUSATE) 27-1.25-300 MG CAPS Take 1 capsule by mouth daily before breakfast. 90 capsule 4      Review of Systems   All systems reviewed and negative except as stated in HPI  Blood pressure 121/84, pulse (!) 113, temperature 98.2 F (36.8 C), resp. rate 18, height 5\' 1"  (1.549 m), weight 84.4 kg, last menstrual period 10/20/2020, unknown if currently breastfeeding. General appearance: alert Lungs: clear to auscultation bilaterally Heart: regular rate and rhythm Abdomen: soft, non-tender; bowel sounds normal Extremities: Homans sign is negative, no sign of DVT Presentation: cephalic Fetal monitoringBaseline: 150 bpm, Variability: Good {> 6 bpm), Accelerations: absent, and Decelerations: Absent Uterine activity every 1-2 min     Prenatal labs: ABO, Rh: A/Positive/-- (08/30 1623) Antibody: Negative (08/30 1623) Rubella: 2.83 (08/30 1623) RPR: Non Reactive (11/15 0950)  HBsAg: Negative (08/30 1623)  HIV: Non Reactive (11/15 0950)  GBS: Positive/-- (01/18 1530)  2 hr Glucola normal Genetic screening  AFP neg, NIPS LR female Anatomy US normal  Prenatal Transfer Tool  Maternal Diabetes: No Genetic Screening: Normal Maternal Ultrasounds/Referrals: Normal Fetal Ultrasounds or other Referrals:  None Maternal Substance Abuse:  No Significant Maternal Medications:  None Significant Maternal Lab Results: Group B Strep positive  No results found for this or any previous visit (from the past 24 hour(s)).  Patient Active Problem List   Diagnosis Date Noted   Headache in pregnancy, antepartum, second trimester 05/02/2021   Supervision of low-risk pregnancy 02/12/2021   History of gestational diabetes 09/23/2019    Breast lesion 09/23/2019   Alpha thalassemia silent carrier 02/14/2019    Assessment/Plan:  Sherri Schmidt is a 31 y.o. [id number] at [redacted]w[redacted]d here for active labor  #Labor:patient presented at advance dilation with BBOW. BSUS used to confirm vertex. Will expectantly manage. Once adequately treated with GBS ppx can offer AROM if not SROMed or delivered by then. #Pain: Hoping for epidural pending admission labs #FWB: Cat I #ID:  GBS pos>Ampicillin #MOF: breast #MOC:considering depo  #Circ:  yes  Warner Mccreedy, MD  08/11/2021, 1:14 AM

## 2021-08-12 ENCOUNTER — Other Ambulatory Visit (HOSPITAL_COMMUNITY): Payer: Self-pay

## 2021-08-12 LAB — CBC
HCT: 30.1 % — ABNORMAL LOW (ref 36.0–46.0)
Hemoglobin: 9.8 g/dL — ABNORMAL LOW (ref 12.0–15.0)
MCH: 29 pg (ref 26.0–34.0)
MCHC: 32.6 g/dL (ref 30.0–36.0)
MCV: 89.1 fL (ref 80.0–100.0)
Platelets: 139 10*3/uL — ABNORMAL LOW (ref 150–400)
RBC: 3.38 MIL/uL — ABNORMAL LOW (ref 3.87–5.11)
RDW: 13.5 % (ref 11.5–15.5)
WBC: 6.9 10*3/uL (ref 4.0–10.5)
nRBC: 0 % (ref 0.0–0.2)

## 2021-08-12 MED ORDER — ACETAMINOPHEN 500 MG PO TABS
1000.0000 mg | ORAL_TABLET | Freq: Three times a day (TID) | ORAL | 0 refills | Status: DC | PRN
  Filled 2021-08-12: qty 60, 10d supply, fill #0

## 2021-08-12 MED ORDER — FERROUS SULFATE 325 (65 FE) MG PO TABS
325.0000 mg | ORAL_TABLET | ORAL | Status: DC
  Administered 2021-08-12: 325 mg via ORAL
  Filled 2021-08-12: qty 1

## 2021-08-12 MED ORDER — FUROSEMIDE 20 MG PO TABS
20.0000 mg | ORAL_TABLET | Freq: Every day | ORAL | 0 refills | Status: DC
  Filled 2021-08-12: qty 3, 3d supply, fill #0

## 2021-08-12 MED ORDER — IBUPROFEN 600 MG PO TABS
600.0000 mg | ORAL_TABLET | Freq: Four times a day (QID) | ORAL | 0 refills | Status: DC | PRN
  Filled 2021-08-12: qty 40, 10d supply, fill #0

## 2021-08-12 NOTE — Anesthesia Postprocedure Evaluation (Signed)
Anesthesia Post Note  Patient: Sherri Schmidt  Procedure(s) Performed: AN AD HOC LABOR EPIDURAL     Patient location during evaluation: Mother Baby Anesthesia Type: Epidural Level of consciousness: awake and alert and oriented Pain management: satisfactory to patient Vital Signs Assessment: post-procedure vital signs reviewed and stable Respiratory status: respiratory function stable Cardiovascular status: stable Postop Assessment: no headache, no backache, epidural receding, patient able to bend at knees, no signs of nausea or vomiting, adequate PO intake and able to ambulate Anesthetic complications: no   No notable events documented.  Last Vitals:  Vitals:   08/12/21 0341 08/12/21 0845  BP: 133/79 115/86  Pulse: 74 72  Resp: 16   Temp: 36.9 C   SpO2: 100%     Last Pain:  Vitals:   08/12/21 0845  TempSrc:   PainSc: 0-No pain   Pain Goal:                Epidural/Spinal Function Cutaneous sensation: Normal sensation (08/12/21 0845), Patient able to flex knees: Yes (08/12/21 0845), Patient able to lift hips off bed: Yes (08/12/21 0845), Back pain beyond tenderness at insertion site: No (08/12/21 0845), Progressively worsening motor and/or sensory loss: No (08/12/21 0845), Bowel and/or bladder incontinence post epidural: No (08/12/21 0845)  Lanea Vankirk

## 2021-08-14 ENCOUNTER — Encounter: Payer: BC Managed Care – PPO | Admitting: Obstetrics

## 2021-08-16 ENCOUNTER — Ambulatory Visit (INDEPENDENT_AMBULATORY_CARE_PROVIDER_SITE_OTHER): Payer: BC Managed Care – PPO | Admitting: *Deleted

## 2021-08-16 ENCOUNTER — Other Ambulatory Visit: Payer: Self-pay

## 2021-08-16 VITALS — BP 123/79 | HR 93

## 2021-08-16 DIAGNOSIS — O165 Unspecified maternal hypertension, complicating the puerperium: Secondary | ICD-10-CM

## 2021-08-16 NOTE — Progress Notes (Signed)
Subjective:  Sherri Schmidt is a 31 y.o. female here for BP check.   Hypertension ROS: no TIA's, no chest pain on exertion, no dyspnea on exertion, and no swelling of ankles. No headaches, blurry vision, dizziness, heartburn or RUQ abdominal pain.   Objective:  LMP 10/20/2020   Appearance alert, well appearing, and in no distress and oriented to person, place, and time. General exam BP noted to be well controlled today in office.    Assessment:   Blood Pressure well controlled.   Plan:  Current treatment plan is effective, no change in therapy. Follow up as scheduled 10/07/21 at postpartum appt.

## 2021-08-17 ENCOUNTER — Inpatient Hospital Stay (HOSPITAL_COMMUNITY): Payer: BC Managed Care – PPO

## 2021-08-17 ENCOUNTER — Inpatient Hospital Stay (HOSPITAL_COMMUNITY)
Admission: AD | Admit: 2021-08-17 | Payer: BC Managed Care – PPO | Source: Home / Self Care | Admitting: Obstetrics & Gynecology

## 2021-09-04 DIAGNOSIS — Z419 Encounter for procedure for purposes other than remedying health state, unspecified: Secondary | ICD-10-CM | POA: Diagnosis not present

## 2021-10-05 DIAGNOSIS — Z419 Encounter for procedure for purposes other than remedying health state, unspecified: Secondary | ICD-10-CM | POA: Diagnosis not present

## 2021-10-07 ENCOUNTER — Ambulatory Visit (INDEPENDENT_AMBULATORY_CARE_PROVIDER_SITE_OTHER): Payer: BC Managed Care – PPO | Admitting: Obstetrics & Gynecology

## 2021-10-07 ENCOUNTER — Encounter: Payer: Self-pay | Admitting: Obstetrics & Gynecology

## 2021-10-07 LAB — POCT URINE PREGNANCY: Preg Test, Ur: NEGATIVE

## 2021-10-07 MED ORDER — MEDROXYPROGESTERONE ACETATE 150 MG/ML IM SUSP
150.0000 mg | Freq: Once | INTRAMUSCULAR | Status: AC
  Administered 2021-10-07: 150 mg via INTRAMUSCULAR

## 2021-10-07 MED ORDER — MEDROXYPROGESTERONE ACETATE 150 MG/ML IM SUSP: 150.0000 mg | INTRAMUSCULAR | 4 refills | Status: DC

## 2021-10-07 NOTE — Progress Notes (Addendum)
4 weeks PP, not breastfeeding. Patient desires Depo for [facility]. ?UPT today is Negative. ?Office stock DEPO Injection given in RD, tolerated well. ? ?Next DEPO due May 19 - December 06, 2021 ? ?Administrations This Visit   ? ? medroxyPROGESTERone (DEPO-PROVERA) injection 150 mg   ? ? Admin Date ?10/07/2021 Action ?Given Dose ?150 mg Route ?Intramuscular Administered By ?Maretta Bees, RMA  ? ?  ?  ? ?  ?  ? ?

## 2021-10-07 NOTE — Progress Notes (Signed)
? ? ?Post Partum Visit Note ? ?Sherri Schmidt is a 31 y.o. [phone] female who presents for a postpartum visit. She is 4 weeks postpartum following a normal spontaneous vaginal delivery.  I have fully reviewed the prenatal and intrapartum course. The delivery was at 39.3 gestational weeks.  Anesthesia: epidural. Postpartum course has been unremarkable. Baby is doing well. Baby is feeding by bottle Rush Barer . Bleeding no bleeding. Bowel function is normal. Bladder function is normal. Patient is not sexually active. Contraception method is Depo-Provera injections. Postpartum depression screening: negative. ? ? ?The pregnancy intention screening data noted above was reviewed. Potential methods of contraception were discussed. The patient elected to proceed with No data recorded. ? ? Edinburgh Postnatal Depression Scale - 10/07/21 0954   ? ?  ? Edinburgh Postnatal Depression Scale:  In the Past 7 Days  ? I have been able to laugh and see the funny side of things. 0   ? I have looked forward with enjoyment to things. 0   ? I have blamed myself unnecessarily when things went wrong. 0   ? I have been anxious or worried for no good reason. 0   ? I have felt scared or panicky for no good reason. 0   ? Things have been getting on top of me. 0   ? I have been so unhappy that I have had difficulty sleeping. 0   ? I have felt sad or miserable. 0   ? I have been so unhappy that I have been crying. 0   ? The thought of harming myself has occurred to me. 0   ? Edinburgh Postnatal Depression Scale Total 0   ? ?  ?  ? ?  ? ? ?Health Maintenance Due  ?Topic Date Due  ? COVID-19 Vaccine (1) Never done  ? TETANUS/TDAP  Never done  ? ? ?The following portions of the patient's history were reviewed and updated as appropriate: allergies, current medications, past family history, past medical history, past social history, past surgical history, and problem list. ? ?Review of Systems ?Pertinent items are noted in HPI. ? ?Objective:  ?BP  119/78   Pulse 72   Ht 5\' 1"  (1.549 m)   Wt 181 lb (82.1 kg)   LMP 09/29/2021 (Approximate)   Breastfeeding No   BMI 34.20 kg/m?   ? ?General:  alert, cooperative, and no distress  ? Breasts:  not indicated  ?Lungs: Effort normal  ?Heart:    ?Abdomen: soft, non-tender; bowel sounds normal; no masses,  no organomegaly   ?Wound N/A  ?GU exam:  not indicated  ?     ?Assessment:  ? ? There are no diagnoses linked to this encounter. ? ?normal postpartum exam.  ? ?Plan:  ? ?Essential components of care per ACOG recommendations: ? ?1.  Mood and well being: Patient with negative depression screening today. Reviewed local resources for support.  ?- Patient tobacco use? No.   ?- hx of drug use? No.   ? ?2. Infant care and feeding:  ?-Patient currently breastmilk feeding? No.  ?-Social determinants of health (SDOH) reviewed in EPIC. No concerns ? ?3. Sexuality, contraception and birth spacing ?- Patient does not want a pregnancy in the next year.  Desired family size is 3 children.  ?- Reviewed reproductive life planning. Reviewed contraceptive methods based on pt preferences and effectiveness.  Patient desired Hormonal Injection today.   ?- Discussed birth spacing of 18 months ? ?4. Sleep and fatigue ?-Encouraged  family/partner/community support of 4 hrs of uninterrupted sleep to help with mood and fatigue ? ?5. Physical Recovery  ?- Discussed patients delivery and complications. She describes her labor as good. ?- Patient had a Vaginal, no problems at delivery. No laceration. Perineal healing reviewed. Patient expressed understanding ?- Patient has urinary incontinence? No. ?- Patient is safe to resume physical and sexual activity ? ?6.  Health Maintenance ?- HM due items addressed No -   ?- Last pap smear  ?Diagnosis  ?Date Value Ref Range Status  ?03/05/2021     ? - Negative for intraepithelial lesion or malignancy (NILM)  ? Pap smear not done at today's visit.  ?-Breast Cancer screening indicated? No.  ? ?7. Chronic  Disease/Pregnancy Condition follow up: None ? ?- PCP follow up ? ?Adam Phenix, MD ?Center for [facility], [facility]  ?

## 2021-10-15 DIAGNOSIS — Z7689 Persons encountering health services in other specified circumstances: Secondary | ICD-10-CM | POA: Diagnosis not present

## 2021-10-21 DIAGNOSIS — H5213 Myopia, bilateral: Secondary | ICD-10-CM | POA: Diagnosis not present

## 2021-11-04 DIAGNOSIS — Z419 Encounter for procedure for purposes other than remedying health state, unspecified: Secondary | ICD-10-CM | POA: Diagnosis not present

## 2021-12-05 DIAGNOSIS — Z419 Encounter for procedure for purposes other than remedying health state, unspecified: Secondary | ICD-10-CM | POA: Diagnosis not present

## 2022-01-04 DIAGNOSIS — Z419 Encounter for procedure for purposes other than remedying health state, unspecified: Secondary | ICD-10-CM | POA: Diagnosis not present

## 2022-01-06 ENCOUNTER — Ambulatory Visit: Payer: BC Managed Care – PPO

## 2022-02-04 DIAGNOSIS — Z419 Encounter for procedure for purposes other than remedying health state, unspecified: Secondary | ICD-10-CM | POA: Diagnosis not present

## 2022-03-07 DIAGNOSIS — Z419 Encounter for procedure for purposes other than remedying health state, unspecified: Secondary | ICD-10-CM | POA: Diagnosis not present

## 2022-04-06 DIAGNOSIS — Z419 Encounter for procedure for purposes other than remedying health state, unspecified: Secondary | ICD-10-CM | POA: Diagnosis not present

## 2022-05-07 DIAGNOSIS — Z419 Encounter for procedure for purposes other than remedying health state, unspecified: Secondary | ICD-10-CM | POA: Diagnosis not present

## 2022-06-06 DIAGNOSIS — Z419 Encounter for procedure for purposes other than remedying health state, unspecified: Secondary | ICD-10-CM | POA: Diagnosis not present

## 2022-07-07 DIAGNOSIS — Z419 Encounter for procedure for purposes other than remedying health state, unspecified: Secondary | ICD-10-CM | POA: Diagnosis not present

## 2022-08-07 DIAGNOSIS — Z419 Encounter for procedure for purposes other than remedying health state, unspecified: Secondary | ICD-10-CM | POA: Diagnosis not present

## 2022-09-05 DIAGNOSIS — Z419 Encounter for procedure for purposes other than remedying health state, unspecified: Secondary | ICD-10-CM | POA: Diagnosis not present

## 2022-10-06 DIAGNOSIS — Z419 Encounter for procedure for purposes other than remedying health state, unspecified: Secondary | ICD-10-CM | POA: Diagnosis not present

## 2022-11-05 DIAGNOSIS — Z419 Encounter for procedure for purposes other than remedying health state, unspecified: Secondary | ICD-10-CM | POA: Diagnosis not present

## 2022-12-06 DIAGNOSIS — Z419 Encounter for procedure for purposes other than remedying health state, unspecified: Secondary | ICD-10-CM | POA: Diagnosis not present

## 2022-12-23 ENCOUNTER — Encounter: Payer: Self-pay | Admitting: Obstetrics

## 2022-12-23 ENCOUNTER — Ambulatory Visit (INDEPENDENT_AMBULATORY_CARE_PROVIDER_SITE_OTHER): Payer: BC Managed Care – PPO | Admitting: Obstetrics

## 2022-12-23 VITALS — BP 122/83 | HR 69 | Ht 61.0 in | Wt 181.7 lb

## 2022-12-23 DIAGNOSIS — E669 Obesity, unspecified: Secondary | ICD-10-CM

## 2022-12-23 DIAGNOSIS — Z30013 Encounter for initial prescription of injectable contraceptive: Secondary | ICD-10-CM

## 2022-12-23 DIAGNOSIS — Z3009 Encounter for other general counseling and advice on contraception: Secondary | ICD-10-CM | POA: Diagnosis not present

## 2022-12-23 MED ORDER — MEDROXYPROGESTERONE ACETATE 150 MG/ML IM SUSP: 150.0000 mg | INTRAMUSCULAR | 4 refills | Status: DC

## 2022-12-23 MED ORDER — MEDROXYPROGESTERONE ACETATE 150 MG/ML IM SUSP
150.0000 mg | Freq: Once | INTRAMUSCULAR | Status: AC
  Administered 2022-12-23: 150 mg via INTRAMUSCULAR

## 2022-12-23 NOTE — Progress Notes (Unsigned)
Presents for [facility] consult. Desires Depo. Depo given into RD, tolerated well. Next Depo Sept 3-17.

## 2022-12-23 NOTE — Progress Notes (Unsigned)
Subjective:    Sherri Schmidt is a 32 y.o. female who presents for contraception counseling. The patient has no complaints today. The patient is sexually active. Pertinent past medical history: none.  The information documented in the HPI was reviewed and verified.  Menstrual History: OB History     Gravida  4   Para  3   Term  3   Preterm      AB  1   Living  3      SAB      IAB  1   Ectopic      Multiple  0   Live Births  3            Patient's last menstrual period was 11/24/2022 (approximate).   Patient Active Problem List   Diagnosis Date Noted   History of gestational diabetes 09/23/2019   Breast lesion 09/23/2019   Alpha thalassemia silent carrier 02/14/2019   Past Medical History:  Diagnosis Date   Gestational diabetes     Past Surgical History:  Procedure Laterality Date   NO PAST SURGERIES       Current Outpatient Medications:    acetaminophen (TYLENOL) 500 MG tablet, Take 2 tablets (1,000 mg total) by mouth every 8 (eight) hours as needed (pain). (Patient not taking: Reported on 12/23/2022), Disp: 60 tablet, Rfl: 0   furosemide (LASIX) 20 MG tablet, Take 1 tablet (20 mg total) by mouth daily for 3 days., Disp: 3 tablet, Rfl: 0   ibuprofen (ADVIL) 600 MG tablet, Take 1 tablet (600 mg total) by mouth every 6 (six) hours as needed (pain). (Patient not taking: Reported on 12/23/2022), Disp: 40 tablet, Rfl: 0   medroxyPROGESTERone (DEPO-PROVERA) 150 MG/ML injection, Inject 1 mL (150 mg total) into the muscle every 3 (three) months., Disp: 1 mL, Rfl: 4   Prenat-FeFum-DSS-FA-DHA w/o A (PNV-DHA+DOCUSATE) 27-1.25-300 MG CAPS, Take 1 capsule by mouth daily before breakfast. (Patient not taking: Reported on 12/23/2022), Disp: 90 capsule, Rfl: 4  Current Facility-Administered Medications:    medroxyPROGESTERone (DEPO-PROVERA) injection 150 mg, 150 mg, Intramuscular, Once, Brock Bad, MD Allergies  Allergen Reactions   Other Rash    Clorox  products    Social History   Tobacco Use   Smoking status: Never   Smokeless tobacco: Never  Substance Use Topics   Alcohol use: No    Family History  Problem Relation Age of Onset   Hypertension Mother    Diabetes Mother        Review of Systems Constitutional: negative for weight loss Genitourinary:negative for abnormal menstrual periods and vaginal discharge   Objective:   BP 122/83   Pulse 69   Ht 5\' 1"  (1.549 m)   Wt 181 lb 11.2 oz (82.4 kg)   LMP 11/24/2022 (Approximate)   Breastfeeding No   BMI 34.33 kg/m    General:   Alert and no distress  Skin:   no rash or abnormalities  Lungs:   clear to auscultation bilaterally  Heart:   regular rate and rhythm, S1, S2 normal, no murmur, click, rub or gallop  Breasts:   normal without suspicious masses, skin or nipple changes or axillary nodes  Abdomen:  normal findings: no organomegaly, soft, non-tender and no hernia  Pelvis:  External genitalia: normal general appearance Urinary system: urethral meatus normal and bladder without fullness, nontender Vaginal: normal without tenderness, induration or masses Cervix: normal appearance Adnexa: normal bimanual exam Uterus: anteverted and non-tender, normal size   Lab Review  Urine pregnancy test Labs reviewed yes Radiologic studies reviewed no  I have spent a total of 20 minutes of face-to-face time, excluding clinical staff time, reviewing notes and preparing to see patient, ordering tests and/or medications, and counseling the patient.   Assessment:    32 y.o., starting Depo-Provera injections, no contraindications.   Plan:   1. Encounter for other general counseling and advice on contraception - options discuissed - wants Drpo  2. Encounter for initial prescription of injectable contraceptive Rx: - medroxyPROGESTERone (DEPO-PROVERA) 150 MG/ML injection; Inject 1 mL (150 mg total) into the muscle every 3 (three) months.  Dispense: 1 mL; Refill: 4 -  medroxyPROGESTERone (DEPO-PROVERA) injection 150 mg - POCT urine pregnancy  3. Obesity (BMI 30.0-34.9) - weight reduction recommended     All questions answered. Contraception: Depo-Provera injections. Discussed healthy lifestyle modifications. Agricultural engineer distributed. Follow up in 3 months. Pregnancy test, result: negative.  Meds ordered this encounter  Medications   medroxyPROGESTERone (DEPO-PROVERA) 150 MG/ML injection    Sig: Inject 1 mL (150 mg total) into the muscle every 3 (three) months.    Dispense:  1 mL    Refill:  4   medroxyPROGESTERone (DEPO-PROVERA) injection 150 mg    Brock Bad, MD 12/23/2022 2:46 PM

## 2022-12-24 LAB — POCT URINE PREGNANCY: Preg Test, Ur: NEGATIVE

## 2023-01-05 DIAGNOSIS — Z419 Encounter for procedure for purposes other than remedying health state, unspecified: Secondary | ICD-10-CM | POA: Diagnosis not present

## 2023-02-05 DIAGNOSIS — Z419 Encounter for procedure for purposes other than remedying health state, unspecified: Secondary | ICD-10-CM | POA: Diagnosis not present

## 2023-03-08 DIAGNOSIS — Z419 Encounter for procedure for purposes other than remedying health state, unspecified: Secondary | ICD-10-CM | POA: Diagnosis not present

## 2023-03-09 DIAGNOSIS — Z7689 Persons encountering health services in other specified circumstances: Secondary | ICD-10-CM | POA: Diagnosis not present

## 2023-03-17 ENCOUNTER — Ambulatory Visit: Payer: BC Managed Care – PPO

## 2023-04-07 DIAGNOSIS — Z419 Encounter for procedure for purposes other than remedying health state, unspecified: Secondary | ICD-10-CM | POA: Diagnosis not present

## 2023-05-08 DIAGNOSIS — Z419 Encounter for procedure for purposes other than remedying health state, unspecified: Secondary | ICD-10-CM | POA: Diagnosis not present

## 2023-06-07 DIAGNOSIS — Z419 Encounter for procedure for purposes other than remedying health state, unspecified: Secondary | ICD-10-CM | POA: Diagnosis not present

## 2023-07-08 DIAGNOSIS — Z419 Encounter for procedure for purposes other than remedying health state, unspecified: Secondary | ICD-10-CM | POA: Diagnosis not present

## 2023-08-08 DIAGNOSIS — Z419 Encounter for procedure for purposes other than remedying health state, unspecified: Secondary | ICD-10-CM | POA: Diagnosis not present

## 2023-09-05 DIAGNOSIS — Z419 Encounter for procedure for purposes other than remedying health state, unspecified: Secondary | ICD-10-CM | POA: Diagnosis not present

## 2023-10-17 DIAGNOSIS — Z419 Encounter for procedure for purposes other than remedying health state, unspecified: Secondary | ICD-10-CM | POA: Diagnosis not present

## 2023-11-16 DIAGNOSIS — Z419 Encounter for procedure for purposes other than remedying health state, unspecified: Secondary | ICD-10-CM | POA: Diagnosis not present

## 2023-12-17 DIAGNOSIS — Z419 Encounter for procedure for purposes other than remedying health state, unspecified: Secondary | ICD-10-CM | POA: Diagnosis not present

## 2024-01-16 DIAGNOSIS — Z419 Encounter for procedure for purposes other than remedying health state, unspecified: Secondary | ICD-10-CM | POA: Diagnosis not present

## 2024-01-21 ENCOUNTER — Ambulatory Visit: Admitting: Obstetrics and Gynecology

## 2024-01-21 ENCOUNTER — Encounter: Payer: Self-pay | Admitting: Obstetrics and Gynecology

## 2024-01-21 ENCOUNTER — Other Ambulatory Visit (HOSPITAL_COMMUNITY)
Admission: RE | Admit: 2024-01-21 | Discharge: 2024-01-21 | Disposition: A | Discharge status: Home / Self Care | Source: Ambulatory Visit | Attending: Physician Assistant | Admitting: Physician Assistant

## 2024-01-21 VITALS — BP 127/80 | HR 77 | Ht 61.0 in | Wt 187.0 lb

## 2024-01-21 DIAGNOSIS — Z113 Encounter for screening for infections with a predominantly sexual mode of transmission: Secondary | ICD-10-CM | POA: Diagnosis not present

## 2024-01-21 DIAGNOSIS — Z3009 Encounter for other general counseling and advice on contraception: Secondary | ICD-10-CM

## 2024-01-21 DIAGNOSIS — Z01419 Encounter for gynecological examination (general) (routine) without abnormal findings: Secondary | ICD-10-CM | POA: Diagnosis not present

## 2024-01-21 DIAGNOSIS — Z7689 Persons encountering health services in other specified circumstances: Secondary | ICD-10-CM | POA: Diagnosis not present

## 2024-01-21 LAB — POCT URINE PREGNANCY: Preg Test, Ur: NEGATIVE

## 2024-01-21 MED ORDER — NORETHIN ACE-ETH ESTRAD-FE 1-20 MG-MCG PO TABS
1.0000 | ORAL_TABLET | Freq: Every day | ORAL | 1 refills | Status: AC
Start: 2024-01-21 — End: ?

## 2024-01-21 NOTE — Patient Instructions (Signed)
   We recommended establishing primary care so you have a doctor after delivery of your baby to help make sure you remain healthy and have a place to be seen for any chronic conditions or if you get sick. [facility] has a way for patients to sign up online for a primary care visit.   There are options for Family Medicine- doctors that can see your whole family Internal Medicine- doctors that can see only people > 31 years old   Here is the website: [email or web address]  

## 2024-01-21 NOTE — Progress Notes (Signed)
 Pt is interested in tubal ligation; would like to discuss this procedure with provider today. Would also like to discuss birth control options (possibly pills) until procedure if applicable.   Pt states her cycle doesn't occur every month currently.

## 2024-01-21 NOTE — Progress Notes (Signed)
 ANNUAL EXAM Patient name: Sherri Schmidt MRN [id number]  Date of birth: [birth year] Chief Complaint:   Contraception  History of Present Illness:   Sherri Schmidt is a 33 y.o. [phone] African-American female being seen today for a routine annual exam.  Current complaints: Desires BTL. Is positive she does not want to have any more children. Requesting STI screening as well. No breast concerns.  No hx of migraines, clotting disorders.  Patient's last menstrual period was 01/08/2024 (approximate).   The pregnancy intention screening data noted above was reviewed. Potential methods of contraception were discussed. The patient elected to proceed with OCPs.  Last pap 8/22. Results were: NILM w/ HRHPV not done. H/O abnormal pap: no     07/24/2021    2:41 PM 05/21/2021   10:13 AM 02/12/2021    8:56 AM  Depression screen PHQ 2/9  Decreased Interest 0 0 0  Down, Depressed, Hopeless 0 0 0  PHQ - 2 Score 0 0 0  Altered sleeping 0 0 1  Tired, decreased energy 1 0 1  Change in appetite 0 0 0  Feeling bad or failure about yourself  0 0 0  Trouble concentrating 0 0 0  Moving slowly or fidgety/restless 0 0 0  Suicidal thoughts 0 0 0  PHQ-9 Score 1 0 2  Difficult doing work/chores Not difficult at all  Not difficult at all        05/21/2021   10:13 AM  GAD 7 : Generalized Anxiety Score  Nervous, Anxious, on Edge 0  Control/stop worrying 0  Worry too much - different things 0  Trouble relaxing 0  Restless 0  Easily annoyed or irritable 0  Afraid - awful might happen 0  Total GAD 7 Score 0     Review of Systems:   Pertinent items are noted in HPI Denies any headaches, blurred vision, fatigue, shortness of breath, chest pain, abdominal pain, abnormal vaginal discharge/itching/odor/irritation, problems with periods, bowel movements, urination, or intercourse unless otherwise stated above. Pertinent History Reviewed:  Reviewed past medical,surgical, social and family history.   Reviewed problem list, medications and allergies. Physical Assessment:   Vitals:   01/21/24 1025  BP: 127/80  Pulse: 77  Weight: 187 lb (84.8 kg)  Height: 5' 1 (1.549 m)  Body mass index is 35.33 kg/m.        Physical Examination:   General appearance - well appearing, and in no distress  Mental status - alert, oriented to person, place, and time  Psych:  She has a normal mood and affect  Skin - warm and dry, normal color, no suspicious lesions noted  Chest - effort normal  Heart - normal rate and regular rhythm  Neck:  midline trachea  Abdomen - soft, nontender, nondistended, no masses or organomegaly  Pelvic - VULVA: normal appearing vulva with no masses, tenderness or lesions  VAGINA: normal appearing vagina with normal color and discharge, no lesions  CERVIX: normal appearing cervix without discharge or lesions, no CMT  Thin prep pap is done with HR HPV cotesting  UTERUS: uterus is felt to be normal size, shape, consistency and nontender   ADNEXA: No adnexal masses or tenderness noted.  Extremities:  No swelling or varicosities noted  Chaperone present for exam  Results for orders placed or performed in visit on 01/21/24 (from the past 24 hours)  POCT urine pregnancy   Collection Time: 01/21/24 10:47 AM  Result Value Ref Range   Preg Test, Ur Negative Negative  Assessment & Plan:  1. Well woman exam with routine gynecological exam (Primary) Pap collected. Info provided for finding PCP. - POCT urine pregnancy - Cytology - PAP( [facility]) - norethindrone-ethinyl estradiol-FE (LOESTRIN FE) 1-20 MG-MCG tablet; Take 1 tablet by mouth daily.  Dispense: 90 tablet; Refill: 1  2. Screening examination for STI - Cervicovaginal ancillary only( ) - Hepatitis B Surface AntiGEN - Hepatitis C Antibody - RPR - HIV antibody (with reflex)  3. Sterilization consult Patient desires BTL. Discussed other contraception options as well. Will start OCPs until surgery.  Advised to use back-up contraception for 1 week after starting OCPs. Consents signed today. Will consult with GYN MD for surgical planning.  Mammogram: @ 31yo, or sooner if problems Colonoscopy: @ 31yo, or sooner if problems  Orders Placed This Encounter  Procedures   Hepatitis B Surface AntiGEN   Hepatitis C Antibody   RPR   HIV antibody (with reflex)   POCT urine pregnancy    Meds:  Meds ordered this encounter  Medications   norethindrone-ethinyl estradiol-FE (LOESTRIN FE) 1-20 MG-MCG tablet    Sig: Take 1 tablet by mouth daily.    Dispense:  90 tablet    Refill:  1    Follow-up: Return in about 4 weeks (around 02/18/2024) for GYN BTL consult.  Sherri CHRISTELLA Moats, MD 01/21/2024 11:08 AM

## 2024-01-22 LAB — CERVICOVAGINAL ANCILLARY ONLY
Bacterial Vaginitis (gardnerella): NEGATIVE
Candida Glabrata: NEGATIVE
Candida Vaginitis: NEGATIVE
Chlamydia: NEGATIVE
Comment: NEGATIVE
Comment: NEGATIVE
Comment: NEGATIVE
Comment: NEGATIVE
Comment: NEGATIVE
Comment: NORMAL
Neisseria Gonorrhea: NEGATIVE
Trichomonas: NEGATIVE

## 2024-01-22 LAB — RPR: RPR Ser Ql: NONREACTIVE

## 2024-01-22 LAB — HIV ANTIBODY (ROUTINE TESTING W REFLEX): HIV Screen 4th Generation wRfx: NONREACTIVE

## 2024-01-22 LAB — HEPATITIS C ANTIBODY: Hep C Virus Ab: NONREACTIVE

## 2024-01-22 LAB — HEPATITIS B SURFACE ANTIGEN: Hepatitis B Surface Ag: NEGATIVE

## 2024-01-23 ENCOUNTER — Ambulatory Visit: Payer: Self-pay | Admitting: Obstetrics and Gynecology

## 2024-01-27 LAB — CYTOLOGY - PAP
Comment: NEGATIVE
Comment: NEGATIVE
Comment: NEGATIVE
Diagnosis: NEGATIVE
HPV 16: NEGATIVE
HPV 18 / 45: NEGATIVE
High risk HPV: POSITIVE — AB

## 2024-01-28 NOTE — Telephone Encounter (Signed)
 Returned call, s/w pt about pap results, advised would sent HPV info through mychart

## 2024-02-12 ENCOUNTER — Encounter: Payer: Self-pay | Admitting: Obstetrics and Gynecology

## 2024-02-12 DIAGNOSIS — R87820 Cervical low risk human papillomavirus (HPV) DNA test positive: Secondary | ICD-10-CM | POA: Insufficient documentation

## 2024-02-16 DIAGNOSIS — Z419 Encounter for procedure for purposes other than remedying health state, unspecified: Secondary | ICD-10-CM | POA: Diagnosis not present

## 2024-02-18 ENCOUNTER — Ambulatory Visit (INDEPENDENT_AMBULATORY_CARE_PROVIDER_SITE_OTHER): Admitting: Obstetrics and Gynecology

## 2024-02-18 ENCOUNTER — Encounter: Payer: Self-pay | Admitting: Obstetrics and Gynecology

## 2024-02-18 VITALS — BP 129/84 | HR 67 | Wt 190.9 lb

## 2024-02-18 DIAGNOSIS — Z3009 Encounter for other general counseling and advice on contraception: Secondary | ICD-10-CM

## 2024-02-18 DIAGNOSIS — R87820 Cervical low risk human papillomavirus (HPV) DNA test positive: Secondary | ICD-10-CM

## 2024-02-18 NOTE — Progress Notes (Signed)
 Bilateral tubal sterilization consult and paperwork.

## 2024-02-18 NOTE — Progress Notes (Signed)
   ESTABLISHED GYNECOLOGY VISIT Chief Complaint  Patient presents with   Follow-up    Subjective:  Sherri Schmidt is a 33 y.o. [phone] presenting for sterilization consult.  She has three children- all boys (ages 9, 4 and 2) and she is certain she is done with having kids.  Denies medical problems and no previous surgeries. Not currently sexually active and declines contraception at this time Patient with questions about HPV pap  Review of Systems:   Pertinent items are noted in HPI  Pertinent History Reviewed:  Reviewed past medical,surgical, social and family history.  Reviewed problem list, medications and allergies.  Objective:   Vitals:   02/18/24 1326  BP: 129/84  Pulse: 67  Weight: 190 lb 14.4 oz (86.6 kg)  Body mass index is 36.07 kg/m.  Physical Examination:   General appearance - well appearing, and in no distress  Mental status - alert, oriented to person, place, and time  Psych:  normal mood and affect  Abdomen: soft, nontender, nondistended  Assessment and Plan:  1. Sterilization consult (Primary) Counseled that tubal sterilization is a permanent, non-reversible procedure that would render her sterile. She is 100% certain she does not want more children. We reviewed options including partner vasectomy and LARC methods, which she declines. Forms signed today.  Discussed laparoscopic salpingectomy. Risks of surgery include but are not limited to: bleeding, infection, injury to surrounding organs/tissues (i.e. bowel/bladder/ureters), need for additional procedures, wound complications, hospital re-admission, regret, and VTE. Reviewed restrictions and recovery following surgery - Ambulatory Referral For Surgery Scheduling  2. 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 Reviewed her pap was normal (NILM). Reviewed nature of HPV and effect of pap smear, risks of dysplasia/cervical cancer. Recommend repeat pap/HPV in 1 year.  No  follow-ups on file.  No future appointments.  Rollo ONEIDA Bring, MD, FACOG Obstetrician & Gynecologist, [facility] for [facility], [facility]

## 2024-03-18 DIAGNOSIS — Z419 Encounter for procedure for purposes other than remedying health state, unspecified: Secondary | ICD-10-CM | POA: Diagnosis not present

## 2024-03-28 ENCOUNTER — Encounter (HOSPITAL_COMMUNITY): Payer: Self-pay

## 2024-03-28 ENCOUNTER — Ambulatory Visit (HOSPITAL_COMMUNITY)
Admission: EM | Admit: 2024-03-28 | Discharge: 2024-03-28 | Disposition: A | Discharge status: Home / Self Care | Attending: Physician Assistant | Admitting: Physician Assistant

## 2024-03-28 DIAGNOSIS — H6121 Impacted cerumen, right ear: Secondary | ICD-10-CM

## 2024-03-28 NOTE — ED Provider Notes (Signed)
 [facility]    CSN: [id number] Arrival date & time: 03/28/24  1709      History   Chief Complaint Chief Complaint  Patient presents with   Ear Fullness    HPI Sherri Schmidt is a 33 y.o. female.   Patient presents with right ear muffled/clogged feeling that started several days ago.  She has been using over-the-counter earwax drops with minimal relief.  She denies ear pain.  She denies nasal congestion.    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 Authorizing Provider  norethindrone-ethinyl estradiol-FE (LOESTRIN FE) 1-20 MG-MCG tablet Take 1 tablet by mouth daily. Patient not taking: Reported on 03/28/2024 01/21/24   Nicholaus Almarie CHRISTELLA, MD    Family History Family History  Problem Relation Age of Onset   Hypertension Mother    Diabetes Mother     Social History Social History   Tobacco Use   Smoking status: Never   Smokeless tobacco: Never  Vaping Use   Vaping status: Never Used  Substance Use Topics   Alcohol use: No   Drug use: No     Allergies   Other   Review of Systems Review of Systems  Constitutional:  Negative for chills and fever.  HENT:  Negative for ear pain and sore throat.   Eyes:  Negative for pain and visual disturbance.  Respiratory:  Negative for cough and shortness of breath.   Cardiovascular:  Negative for chest pain  and palpitations.  Gastrointestinal:  Negative for abdominal pain and vomiting.  Genitourinary:  Negative for dysuria and hematuria.  Musculoskeletal:  Negative for arthralgias and back pain.  Skin:  Negative for color change and rash.  Neurological:  Negative for seizures and syncope.  All other systems reviewed and are negative.    Physical Exam Triage Vital Signs ED Triage Vitals [03/28/24 1831]  Encounter Vitals Group     BP      Girls Systolic BP Percentile      Girls Diastolic BP Percentile      Boys Systolic BP Percentile      Boys Diastolic BP Percentile      Pulse      Resp      Temp      Temp src      SpO2      Weight      Height      Head Circumference      Peak Flow      Pain Score 2     Pain Loc      Pain Education  Exclude from Growth Chart    No data found.  Updated Vital Signs There were no vitals taken for this visit.  Visual Acuity Right Eye Distance:   Left Eye Distance:   Bilateral Distance:    Right Eye Near:   Left Eye Near:    Bilateral Near:     Physical Exam Vitals and nursing note reviewed.  Constitutional:      General: She is not in acute distress.    Appearance: She is well-developed.  HENT:     Head: Normocephalic and atraumatic.     Right Ear: There is impacted cerumen.     Left Ear: There is impacted cerumen.  Eyes:     Conjunctiva/sclera: Conjunctivae normal.  Cardiovascular:     Rate and Rhythm: Normal rate and regular rhythm.     Heart sounds: No murmur heard. Pulmonary:     Effort: Pulmonary effort is normal. No respiratory distress.     Breath sounds: Normal breath sounds.  Abdominal:     Palpations: Abdomen is soft.     Tenderness: There is no abdominal tenderness.  Musculoskeletal:        General: No swelling.     Cervical back: Neck supple.  Skin:    General: Skin is warm and dry.     Capillary Refill: Capillary refill takes less than 2 seconds.  Neurological:     Mental Status: She is alert.   Psychiatric:        Mood and Affect: Mood normal.      UC Treatments / Results  Labs (all labs ordered are listed, but only abnormal results are displayed) Labs Reviewed - No data to display  EKG   Radiology No results found.  Procedures Procedures (including critical care time)  Medications Ordered in UC Medications - No data to display  Initial Impression / Assessment and Plan / UC Course  I have reviewed the triage vital signs and the nursing notes.  Pertinent labs & imaging results that were available during my care of the patient were reviewed by me and considered in my medical decision making (see chart for details).     Bilateral cerumen impaction.  Complete removal of earwax in the left ear, right ear still has some earwax.  Advised continued eardrops over the next week and return for reevaluation. Final Clinical Impressions(s) / UC Diagnoses   Final diagnoses:  Impacted cerumen of right ear     Discharge Instructions      Continue with ear drops, return in 1 week for recheck if needed    ED Prescriptions   None    PDMP not reviewed this encounter.   Ward, Harlene PEDLAR, PA-C 03/28/24 1930

## 2024-03-28 NOTE — ED Triage Notes (Signed)
 Pt c/o rt ear clogged since Saturday. States used earwax cleaner drops with no relief.

## 2024-03-28 NOTE — Discharge Instructions (Signed)
 Continue with ear drops, return in 1 week for recheck if needed

## 2024-06-10 ENCOUNTER — Telehealth: Payer: Self-pay

## 2024-06-10 NOTE — Telephone Encounter (Signed)
 I called patient to schedule surgery w/ Dr. Abigail on 07/11/24 at 2:30 pm at [facility]. Patient agreed to details. Pre-Op instructions and surgery details were provided by phone.

## 2024-06-27 ENCOUNTER — Ambulatory Visit (HOSPITAL_COMMUNITY): Admission: EM | Admit: 2024-06-27 | Discharge: 2024-06-27 | Disposition: A | Discharge status: Home / Self Care

## 2024-06-27 ENCOUNTER — Encounter (HOSPITAL_COMMUNITY): Payer: Self-pay

## 2024-06-27 DIAGNOSIS — R509 Fever, unspecified: Secondary | ICD-10-CM | POA: Diagnosis not present

## 2024-06-27 LAB — POC COVID19/FLU A&B COMBO
Covid Antigen, POC: NEGATIVE
Influenza A Antigen, POC: NEGATIVE
Influenza B Antigen, POC: NEGATIVE

## 2024-06-27 MED ORDER — VALACYCLOVIR HCL 1 G PO TABS: 1000.0000 mg | ORAL_TABLET | Freq: Two times a day (BID) | ORAL | 0 refills | Status: AC

## 2024-06-27 MED ORDER — ACETAMINOPHEN 325 MG PO TABS
ORAL_TABLET | ORAL | Status: AC
  Filled 2024-06-27: qty 2

## 2024-06-27 MED ORDER — ACETAMINOPHEN 325 MG PO TABS
650.0000 mg | ORAL_TABLET | Freq: Once | ORAL | Status: AC
  Administered 2024-06-27: 650 mg via ORAL

## 2024-06-27 NOTE — ED Provider Notes (Addendum)
 " [facility]    CSN: [id number] Arrival date & time: 06/27/24  1553      History   Chief Complaint Chief Complaint  Patient presents with   Rash    HPI Sherri Schmidt is a 33 y.o. female.   This 33 year old female is being seen for complaints of painful rash in her rectal area.  She reports painful rash started on Friday.  She reports it is extremely painful if she wipes after using restroom.  It is noted she is febrile today.  She reports close contact with someone who was recently sick with unknown illness.  She endorses chills, mild nasal congestion and cough.  She denies headache, dizziness.  She denies ear pain, sore throat, chest pain, shortness of breath.  She denies abdominal pain, nausea, vomiting, diarrhea.  She denies pelvic pain, vaginal discharge, vaginal pain.  She denies urinary symptoms including dysuria, urgency, frequency.   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 Authorizing Provider  valACYclovir  (VALTREX ) 1000 MG tablet Take 1 tablet (1,000 mg total) by mouth 2 (two) times daily for 10 days. 06/27/24 07/07/24 Yes Lennice Jon BROCKS, FNP    Family History Family History  Problem Relation  Age of Onset   Hypertension Mother    Diabetes Mother     Social History Social History[1]   Allergies   Other   Review of Systems Review of Systems  Constitutional:  Positive for chills and fever. Negative for activity change and appetite change.  HENT:  Positive for congestion. Negative for mouth sores, rhinorrhea, sore throat and trouble swallowing.   Eyes:  Negative for visual disturbance.  Respiratory:  Positive for cough. Negative for shortness of breath.   Cardiovascular:  Negative for chest pain.  Gastrointestinal:  Positive for rectal pain. Negative for abdominal pain, nausea and vomiting.  Genitourinary:  Positive for genital sores. Negative for difficulty urinating, dysuria, frequency, pelvic pain, urgency, vaginal discharge and vaginal pain.  Skin:  Positive for rash. Negative for color change.  Neurological:  Negative for dizziness and headaches.  All other systems reviewed and are negative.    Physical Exam Triage Vital Signs ED Triage Vitals  Encounter Vitals Group     BP 06/27/24 1748 129/89     Girls Systolic BP Percentile --      Girls Diastolic BP Percentile --      Boys Systolic BP Percentile --  Boys Diastolic BP Percentile --      Pulse Rate 06/27/24 1748 92     Resp 06/27/24 1748 16     Temp 06/27/24 1748 (!) 101.4 F (38.6 C)     Temp Source 06/27/24 1748 Oral     SpO2 06/27/24 1748 99 %     Weight --      Height --      Head Circumference --      Peak Flow --      Pain Score 06/27/24 1747 0     Pain Loc --      Pain Education --      Exclude from Growth Chart --    No data found.  Updated Vital Signs BP 129/89 (BP Location: Right Arm)   Pulse 92   Temp 99.9 F (37.7 C) (Oral)   Resp 16   LMP 06/13/2024 (Approximate)   SpO2 99%   Visual Acuity Right Eye Distance:   Left Eye Distance:   Bilateral Distance:    Right Eye Near:   Left Eye Near:    Bilateral Near:     Physical Exam Vitals and nursing note reviewed.   Constitutional:      General: She is not in acute distress.    Appearance: Normal appearance. She is well-developed. She is not ill-appearing or toxic-appearing.     Comments: Pleasant female appearing stated age found sitting in chair in no acute distress.  HENT:     Head: Normocephalic and atraumatic.     Right Ear: External ear normal.     Left Ear: External ear normal.     Nose: Nose normal.     Mouth/Throat:     Lips: Pink.  Eyes:     Conjunctiva/sclera: Conjunctivae normal.  Cardiovascular:     Rate and Rhythm: Normal rate and regular rhythm.     Heart sounds: Normal heart sounds. No murmur heard. Pulmonary:     Effort: Pulmonary effort is normal. No respiratory distress.     Breath sounds: Normal breath sounds.  Abdominal:     General: Bowel sounds are normal.     Palpations: Abdomen is soft.     Tenderness: There is no abdominal tenderness.  Genitourinary:     Comments: Numerous vesicular lesions around anus. Skin:    General: Skin is warm and dry.  Neurological:     Mental Status: She is alert.  Psychiatric:        Mood and Affect: Mood normal.      UC Treatments / Results  Labs (all labs ordered are listed, but only abnormal results are displayed) Labs Reviewed  HSV 1/2 PCR (SURFACE)  POC COVID19/FLU A&B COMBO    EKG   Radiology No results found.  Procedures Procedures (including critical care time)  Medications Ordered in UC Medications  acetaminophen  (TYLENOL ) tablet 650 mg (650 mg Oral Given 06/27/24 1759)    Initial Impression / Assessment and Plan / UC Course  I have reviewed the triage vital signs and the nursing notes.  Pertinent labs & imaging results that were available during my care of the patient were reviewed by me and considered in my medical decision making (see chart for details).     Vitals and triage reviewed, patient is hemodynamically stable.  She is febrile upon presentation, acetaminophen  given.  Presents with numerous  painful vesicular lesions around her anus.  No other lesions.  Suspicion for HSV, swab obtained.  Staff will contact if results are positive.  Prescription for valacyclovir  given.  She declines offer for additional STI testing at this time.  Due to fever, COVID/flu swab obtained and is negative.  Plan of care, follow-up care, return precautions given, no questions at this time. 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 Take the antiviral as prescribed. The swab to test for the virus (HSV) is pending. You'll be able to see your results in MyChart and staff will call you if the test is positive.  If the test is positive, notify sexual partner(s) of results. Do not participate in intercourse until the rash has healed as this increases chance of spread to partner.  Review further information/education on HSV in your discharge packet.  Return to urgent care if you notice pus drainage from the bumps and/or new/worsening pain or if you develop fever, chills, nausea, vomiting, body aches at home.       ED Prescriptions     Medication Sig Dispense Auth. Provider   valACYclovir  (VALTREX ) 1000 MG tablet Take 1 tablet (1,000 mg total) by mouth 2 (two) times daily for 10 days. 20 tablet Izadora Roehr C, FNP      PDMP not reviewed this encounter.    Lennice Jon BROCKS, FNP 06/27/24 1932     [1]  Social History Tobacco Use   Smoking status: Never   Smokeless tobacco: Never  Vaping Use   Vaping status: Never Used  Substance Use Topics   Alcohol use: No   Drug use: No     Lennice Jon BROCKS, FNP 06/27/24 1932  "

## 2024-06-27 NOTE — Discharge Instructions (Addendum)
 Your rash is likely due to herpes simplex virus infection. Take the antiviral as prescribed. The swab to test for the virus (HSV) is pending. You'll be able to see your results in MyChart and staff will call you if the test is positive.  If the test is positive, notify sexual partner(s) of results. Do not participate in intercourse until the rash has healed as this increases chance of spread to partner.  Review further information/education on HSV in your discharge packet.  Return to urgent care if you notice pus drainage from the bumps and/or new/worsening pain or if you develop fever, chills, nausea, vomiting, body aches at home.

## 2024-06-27 NOTE — ED Triage Notes (Signed)
 Patient here today with c/o rash on the inner part of her buttock since Friday evening. Patient used Vaseline with no relief. Patient states that the area burns when she wipes.

## 2024-06-28 LAB — HSV 1/2 PCR (SURFACE)
HSV-1 DNA: NOT DETECTED
HSV-2 DNA: NOT DETECTED

## 2024-07-01 ENCOUNTER — Emergency Department (HOSPITAL_COMMUNITY)
Admission: EM | Admit: 2024-07-01 | Discharge: 2024-07-01 | Disposition: A | Discharge status: Home / Self Care | Attending: Emergency Medicine | Admitting: Emergency Medicine

## 2024-07-01 ENCOUNTER — Encounter (HOSPITAL_COMMUNITY): Payer: Self-pay

## 2024-07-01 ENCOUNTER — Other Ambulatory Visit: Payer: Self-pay

## 2024-07-01 DIAGNOSIS — N3001 Acute cystitis with hematuria: Secondary | ICD-10-CM | POA: Diagnosis not present

## 2024-07-01 DIAGNOSIS — N898 Other specified noninflammatory disorders of vagina: Secondary | ICD-10-CM | POA: Insufficient documentation

## 2024-07-01 LAB — SYPHILIS: RPR W/REFLEX TO RPR TITER AND TREPONEMAL ANTIBODIES, TRADITIONAL SCREENING AND DIAGNOSIS ALGORITHM
RPR Ser Ql: REACTIVE — AB
RPR Titer: 1:1 {titer}

## 2024-07-01 LAB — URINALYSIS, ROUTINE W REFLEX MICROSCOPIC
Bilirubin Urine: NEGATIVE
Glucose, UA: NEGATIVE mg/dL
Ketones, ur: NEGATIVE mg/dL
Nitrite: NEGATIVE
Protein, ur: 30 mg/dL — AB
Specific Gravity, Urine: 1.031 — ABNORMAL HIGH (ref 1.005–1.030)
WBC, UA: >50 WBC/hpf (ref 0–5)
pH: 5 (ref 5.0–8.0)

## 2024-07-01 LAB — PREGNANCY, URINE: Preg Test, Ur: NEGATIVE

## 2024-07-01 LAB — WET PREP, GENITAL
Clue Cells Wet Prep HPF POC: NONE SEEN
Sperm: NONE SEEN
Trich, Wet Prep: NONE SEEN
WBC, Wet Prep HPF POC: <10
Yeast Wet Prep HPF POC: NONE SEEN

## 2024-07-01 LAB — HIV ANTIBODY (ROUTINE TESTING W REFLEX): HIV Screen 4th Generation wRfx: NONREACTIVE

## 2024-07-01 MED ORDER — NITROFURANTOIN MONOHYD MACRO 100 MG PO CAPS: 100.0000 mg | ORAL_CAPSULE | Freq: Two times a day (BID) | ORAL | 0 refills | Status: DC

## 2024-07-01 MED ORDER — LIDOCAINE HCL (PF) 1 % IJ SOLN
1.0000 mL | Freq: Once | INTRAMUSCULAR | Status: AC
  Administered 2024-07-01: 1 mL
  Filled 2024-07-01: qty 30

## 2024-07-01 MED ORDER — CEFTRIAXONE SODIUM 1 G IJ SOLR
500.0000 mg | Freq: Once | INTRAMUSCULAR | Status: AC
  Administered 2024-07-01: 500 mg via INTRAMUSCULAR
  Filled 2024-07-01: qty 10

## 2024-07-01 MED ORDER — DOXYCYCLINE HYCLATE 100 MG PO TABS: 100.0000 mg | ORAL_TABLET | Freq: Two times a day (BID) | ORAL | 0 refills | Status: AC

## 2024-07-01 NOTE — ED Notes (Signed)
 Pt educated on DC instructions , verbalizes understanding, pt waiting for reaction period for medication to be over before exiting room

## 2024-07-01 NOTE — ED Notes (Signed)
 Pelvic exam at B/S

## 2024-07-01 NOTE — ED Provider Notes (Signed)
 " [place] [facility] Provider Note   CSN: [id number] Arrival date & time: 07/01/24  9374     Patient presents with: Vaginal Discharge   Sherri Schmidt is a 33 y.o. female.    Vaginal Discharge  Patient is a 33 year old female with past medical history significant for gestational diabetes she denies any other major medical problems  She presents emergency room today with complaints of vulvar rash as well as thick white discharge she states that the symptoms been ongoing for approximately 3 to 4 days she states that she has a partner/the father of her children who states that he recently tested positive for something after an ER visit but seem to be unable to tell her what he tested positive for.       Prior to Admission medications  Medication Sig Start Date End Date Taking? Authorizing Provider  doxycycline  (VIBRA -TABS) 100 MG tablet Take 1 tablet (100 mg total) by mouth 2 (two) times daily. 07/01/24  Yes Jawanza Zambito S, PA  valACYclovir  (VALTREX ) 1000 MG tablet Take 1 tablet (1,000 mg total) by mouth 2 (two) times daily for 10 days. 06/27/24 07/07/24  Kyker, Angela C, FNP    Allergies: Other    Review of Systems  Genitourinary:  Positive for vaginal discharge.    Updated Vital Signs BP 129/80 (BP Location: Right Arm)   Pulse 98   Temp 98 F (36.7 C) (Oral)   Resp 16   LMP 06/13/2024 (Approximate)   SpO2 98%   Physical Exam Vitals and nursing note reviewed.  Constitutional:      General: She is not in acute distress. HENT:     Head: Normocephalic and atraumatic.     Nose: Nose normal.  Eyes:     General: No scleral icterus. Cardiovascular:     Rate and Rhythm: Normal rate and regular rhythm.     Pulses: Normal pulses.     Heart sounds: Normal heart sounds.  Pulmonary:     Effort: Pulmonary effort is normal. No respiratory distress.     Breath sounds: No wheezing.  Abdominal:     Palpations: Abdomen is soft.      Tenderness: There is no abdominal tenderness. There is no guarding or rebound.  Genitourinary:    Comments: Multiple small shallow ulcers to the follow-up.  No cervical motion tenderness on exam.  No adnexal tenderness.  There is some white vaginal discharge. Musculoskeletal:     Cervical back: Normal range of motion.     Right lower leg: No edema.     Left lower leg: No edema.  Skin:    General: Skin is warm and dry.     Capillary Refill: Capillary refill takes less than 2 seconds.  Neurological:     Mental Status: She is alert. Mental status is at baseline.  Psychiatric:        Mood and Affect: Mood normal.        Behavior: Behavior normal.     (all labs ordered are listed, but only abnormal results are displayed) Labs Reviewed  URINALYSIS, ROUTINE W REFLEX MICROSCOPIC - Abnormal; Notable for the following components:      Result Value   APPearance HAZY (*)    Specific Gravity, Urine 1.031 (*)    Hgb urine dipstick SMALL (*)    Protein, ur 30 (*)    Leukocytes,Ua LARGE (*)    Bacteria, UA RARE (*)    All other components within normal limits  SYPHILIS: RPR W/REFLEX TO RPR TITER AND TREPONEMAL ANTIBODIES, TRADITIONAL SCREENING AND DIAGNOSIS ALGORITHM - Abnormal; Notable for the following components:   RPR Ser Ql Reactive (*)    All other components within normal limits  WET PREP, GENITAL  URINE CULTURE  PREGNANCY, URINE  HIV ANTIBODY (ROUTINE TESTING W REFLEX)  T.PALLIDUM AB, TOTAL  GC/CHLAMYDIA PROBE AMP ([place]) NOT AT [facility]    EKG: None  Radiology: No results found.   Procedures   Medications Ordered in the ED  cefTRIAXone  (ROCEPHIN ) injection 500 mg (500 mg Intramuscular Given 07/01/24 1125)  lidocaine  (PF) (XYLOCAINE ) 1 % injection 1-2.1 mL (1 mL Other Given 07/01/24 1125)                                    Medical Decision Making Amount and/or Complexity of Data Reviewed Labs: ordered.  Risk Prescription drug management.   Patient is a  33 year old female with past medical history significant for gestational diabetes she denies any other major medical problems  She presents emergency room today with complaints of vulvar rash as well as thick white discharge she states that the symptoms been ongoing for approximately 3 to 4 days she states that she has a partner/the father of her children who states that he recently tested positive for something after an ER visit but seem to be unable to tell her what he tested positive for.  She denies any urinary frequency urgency dysuria or hematuria.  No suprapubic pain or abdominal pain.  Urinalysis shows greater than 50 WBCs and large leukocytes with rare bacteria.  As she has no urinary symptoms and she is being treated empirically with doxycycline  for gonorrhea and chlamydia we will hold off on empiric UTI treatment and have her recheck urine with OB/GYN early next week.  Return to the ER for any new or concerning symptoms in meantime.  Your [place] test negative.  I did obtain RPR, wet prep and GC chlamydia.  Wet prep unremarkable.  Doubt PID but will treat empirically for venereal disease.  She will follow-up with OB/GYN  Final diagnoses:  Vaginal discharge  Acute cystitis with hematuria    ED Discharge Orders          Ordered    doxycycline  (VIBRA -TABS) 100 MG tablet  2 times daily        07/01/24 1109    nitrofurantoin , macrocrystal-monohydrate, (MACROBID ) 100 MG capsule  2 times daily,   Status:  Discontinued        07/01/24 1236               Neldon Hamp RAMAN, [place] 07/01/24 1240    Ellouise Richerd POUR, DO 07/01/24 1356  "

## 2024-07-01 NOTE — ED Triage Notes (Signed)
 Pt reports that she has a rash on her vagina, as well as, a thick white discharge x 2-3 days. Pt reports that her significant other was recently tested for STI's and told her that he had a bacteria.

## 2024-07-01 NOTE — Discharge Instructions (Addendum)
You have been treated in the emergency department for an infection, possibly sexually transmitted. Results of your gonorrhea and chlamydia tests are pending and you will be notified if they are positive. Please refrain from intercourse for 7 days and until all sex partners (within previous 60 days) are evaluated and/or treated as well. Please follow up with your primary care provider for continued care and further STD evaluation.  It is very important to practice safe sex and use condoms when sexually active. If your results are positive you need to notify all sexual partners so they can be treated as well. The website http://www.dontspreadit.com/ can be used to send anonymous text messages or emails to alert sexual contacts. Follow up with your doctor, or OBGYN in regards to today's visit.   Gonorrhea and Chlamydia SYMPTOMS  In females, symptoms may go unnoticed. Symptoms that are more noticeable can include:  Belly (abdominal) pain.  Painful intercourse.  Watery mucous-like discharge from the vagina.  Miscarriage.  Discomfort when urinating.  Inflammation of the rectum.  Abnormal gray-green frothy vaginal discharge  Vaginal itching and irritatio  Itching and irritation of the area outside the vagina.   Painful urination.  Bleeding after sexual intercourse.  In males, symptoms include:  Burning with urination.  Pain in the testicles.  Watery mucous-like discharge from the penis.  It can cause longstanding (chronic) pelvic pain after frequent infections.  TREATMENT  PID can cause women to not be able to have children (sterile) if left untreated or if half-treated.  It is important to finish ALL medications given to you.  This is a sexually transmitted infection. So you are also at risk for other sexually transmitted diseases, including HIV (AIDS), it is recommended that you get tested. HOME CARE INSTRUCTIONS  Warning: This infection is contagious. Do not have sex until treatment is  completed. Follow up at your caregiver's office or the clinic to which you were referred. If your diagnosis (learning what is wrong) is confirmed by culture or some other method, your recent sexual contacts need treatment. Even if they are symptom free or have a negative culture or evaluation, they should be treated.  PREVENTION  Women should use sanitary pads instead of tampons for vaginal discharge.  Wipe front to back after using the toilet and avoid douching.   Practice safe sex, use condoms, have only one sex partner and be sure your sex partner is not having sex with others.  Ask your caregiver to test you for chlamydia at your regular checkups or sooner if you are having symptoms.  Ask for further information if you are pregnant.  SEEK IMMEDIATE MEDICAL CARE IF:  You develop an oral temperature above 102 F (38.9 C), not controlled by medications or lasting more than 2 days.  You develop an increase in pain.  You develop any type of abnormal discharge.  You develop vaginal bleeding and it is not time for your period.  You develop painful intercourse.   Bacterial Vaginosis  Bacterial vaginosis (BV) is a vaginal infection where the normal balance of bacteria in the vagina is disrupted. This is not a sexually transmitted disease and your sexual partners do NOT need to be treated. CAUSES  The cause of BV is not fully understood. BV develops when there is an increase or imbalance of harmful bacteria.  Some activities or behaviors can upset the normal balance of bacteria in the vagina and put women at increased risk including:  Having a new sex partner or multiple   sex partners.  Douching.  Using an intrauterine device (IUD) for contraception.  It is not clear what role sexual activity plays in the development of BV. However, women that have never had sexual intercourse are rarely infected with BV.  Women do not get BV from toilet seats, bedding, swimming pools or from touching objects around  them.   SYMPTOMS  Grey vaginal discharge.  A fish-like odor with discharge, especially after sexual intercourse.  Itching or burning of the vagina and vulva.  Burning or pain with urination.  Some women have no signs or symptoms at all.   TREATMENT  Sometimes BV will clear up without treatment.  BV may be treated with antibiotics.  BV can recur after treatment. If this happens, a second round of antibiotics will often be prescribed.  HOME CARE INSTRUCTIONS  Finish all medication as directed by your caregiver.  Do not have sex until treatment is completed.  Do NOT drink any alcoholic beverages while being treated  with Metronidazole (Flagyl). This will cause a severe reaction inducing vomiting.  RESOURCE GUIDE  Dental Problems  Patients with Medicaid: [facility]                     [facility] [address]                                           [address] Phone:  [phone]                                                  Phone:  [phone]  If unable to pay or uninsured, contact:  [facility] or [facility] to become qualified for the adult dental clinic.  Chronic Pain Problems Contact [facility]  [phone] Patients need to be referred by their primary care doctor.  Insufficient Money for Medicine Contact [employer]:  call "211" or [facility] 271-5999.  No Primary Care Doctor Call [facility]  832-8000 Other agencies that provide inexpensive medical care    [facility]  832-8035    [facility]  832-7272    [facility]  [phone]    Women's Clinic  832-4777    [facility]  [phone]    [facility]  [phone]  Psychological Services [facility]  832-9600 [facility]  [phone] [facility]   800 853-5163 (emergency services 641-4993)  Substance Abuse Resources [facility]   336-882-2125 [facility] 336-784-9470 [facility] 336-285-9073 [facility] 336-845-3988 Residential & Outpatient Substance Abuse Program  800-659-3381  Abuse/Neglect [place] Child Abuse Hotline (336) 641-3795 [place] Child Abuse Hotline 800-378-5315 (After Hours)  Emergency Shelter [facility] (336) 271-5985  [facility] (336) 275-9566 [facility] (704) 372-4663  MRSA Hotline #:   832-7006    [place] [facility]  [facility] [place]     [employer]                          [facility] [address] [place]                       [address]      [address]  [place]                                                [place]                            [place] Phone:  [phone]                                   Phone:  [phone]                 Phone:  [phone]  [facility] Phone:  342-8316  [place] Child Abuse Hotline (336) 342-1394 (336) 342-3537 (After Hours)   

## 2024-07-02 LAB — URINE CULTURE: Culture: >=100000 — AB

## 2024-07-03 ENCOUNTER — Telehealth (HOSPITAL_BASED_OUTPATIENT_CLINIC_OR_DEPARTMENT_OTHER): Payer: Self-pay | Admitting: *Deleted

## 2024-07-03 NOTE — Telephone Encounter (Signed)
 Post ED Visit - Positive Culture Follow-up  Culture report reviewed by antimicrobial stewardship pharmacist: [facility] []  Rankin Dee, Pharm.D. []  Venetia Gully, [address].D., BCPS AQ-ID []  Garrel Crews, Pharm.D., BCPS []  Almarie Lunger, Pharm.D., BCPS []  Perla, [place].D., BCPS, AAHIVP []  Rosaline Bihari, Pharm.D., BCPS, AAHIVP []  Vernell Meier, PharmD, BCPS []  Latanya Hint, PharmD, BCPS []  Donald Medley, PharmD, BCPS []  Rocky Bold, PharmD []  Dorothyann Alert, PharmD, BCPS []  Morene Babe, PharmD  [facility] []  Rosaline Edison, PharmD []  Romona Bliss, PharmD []  Dolphus Roller, PharmD []  Veva Seip, Rph []  Vernell Daunt) Leonce, PharmD []  Eva Allis, PharmD []  Rosaline Millet, PharmD []  Iantha Batch, PharmD []  Arvin Gauss, PharmD []  Wanda Hasting, PharmD []  Ronal Rav, PharmD []  Rocky Slade, PharmD [x]  Lacinda Moats, PharmD   Positive urine culture Treated with Doxycycline  and macrobid ,.  Plan: continue current regimen, no changes per Dr. Ellouise Frost, Alan Novak 07/03/2024, 9:55 AM

## 2024-07-04 LAB — T.PALLIDUM AB, TOTAL: T Pallidum Abs: REACTIVE — AB

## 2024-07-04 LAB — GC/CHLAMYDIA PROBE AMP (~~LOC~~) NOT AT ARMC
Chlamydia: NEGATIVE
Comment: NEGATIVE
Comment: NORMAL
Neisseria Gonorrhea: NEGATIVE

## 2024-07-04 NOTE — Pre-Procedure Instructions (Signed)
 Surgical Instructions   Your procedure is scheduled on July 11, 2024. Report to [facility] Main Entrance A at 12:30 P.M., then check in with the Admitting office. Any questions or running late day of surgery: call [phone]  Questions prior to your surgery date: call [phone], Monday-Friday, 8am-4pm. If you experience any cold or flu symptoms such as cough, fever, chills, shortness of breath, etc. between now and your scheduled surgery, please notify us  at the above number.     Remember:  Do not eat after midnight the night before your surgery   You may drink clear liquids until 11:30 AM the morning of your surgery.   Clear liquids allowed are: Water, Non-Citrus Juices (without pulp), Carbonated Beverages, Clear Tea (no milk, honey, etc.), Black Coffee Only (NO MILK, CREAM OR POWDERED CREAMER of any kind), and Gatorade.    Take these medicines the morning of surgery with A SIP OF WATER: doxycycline  (VIBRA -TABS)  valACYclovir  (VALTREX )    One week prior to surgery, STOP taking any Aspirin (unless otherwise instructed by your surgeon) Aleve , Naproxen , Ibuprofen , Motrin , Advil , Goody's, BC's, all herbal medications, fish oil, and non-prescription vitamins.                     Do NOT Smoke (Tobacco/Vaping) for 24 hours prior to your procedure.  If you use a CPAP at night, you may bring your mask/headgear for your overnight stay.   You will be asked to remove any contacts, glasses, piercing's, hearing aid's, dentures/partials prior to surgery. Please bring cases for these items if needed.    Patients discharged the day of surgery will not be allowed to drive home, and someone needs to stay with them for 24 hours.  SURGICAL WAITING ROOM VISITATION Patients may have no more than 2 support people in the waiting area - these visitors may rotate.   Pre-op nurse will coordinate an appropriate time for 1 ADULT support person, who may not rotate, to accompany patient in pre-op.   Children under the age of 31 must have an adult with them who is not the patient and must remain in the main waiting area with an adult.  If the patient needs to stay at the hospital during part of their recovery, the visitor guidelines for inpatient rooms apply.  Please refer to the [facility] website for the visitor guidelines for any additional information.   If you received a COVID test during your pre-op visit  it is requested that you wear a mask when out in public, stay away from anyone that may not be feeling well and notify your surgeon if you develop symptoms. If you have been in contact with anyone that has tested positive in the last 10 days please notify you surgeon.      Pre-operative CHG Bathing Instructions   You can play a key role in reducing the risk of infection after surgery. Your skin needs to be as free of germs as possible. You can reduce the number of germs on your skin by washing with CHG (chlorhexidine gluconate) soap before surgery. CHG is an antiseptic soap that kills germs and continues to kill germs even after washing.   DO NOT use if you have an allergy to chlorhexidine/CHG or antibacterial soaps. If your skin becomes reddened or irritated, stop using the CHG and notify one of our RNs at [phone].              TAKE A SHOWER THE NIGHT BEFORE SURGERY  Please keep in mind the following:  DO NOT shave, including legs and underarms, 48 hours prior to surgery.   You may shave your face before/day of surgery.  Place clean sheets on your bed the night before surgery Use a clean washcloth (not used since being washed) for shower. DO NOT sleep with pet's night before surgery.  CHG Shower Instructions:  Wash your face and private area with normal soap. If you choose to wash your hair, wash first with your normal shampoo.  After you use shampoo/soap, rinse your hair and body thoroughly to remove shampoo/soap residue.  Turn the water OFF and apply half the  bottle of CHG soap to a CLEAN washcloth.  Apply CHG soap ONLY FROM YOUR NECK DOWN TO YOUR TOES (washing for 3-5 minutes)  DO NOT use CHG soap on face, private areas, open wounds, or sores.  Pay special attention to the area where your surgery is being performed.  If you are having back surgery, having someone wash your back for you may be helpful. Wait 2 minutes after CHG soap is applied, then you may rinse off the CHG soap.  Pat dry with a clean towel  Put on clean pajamas    Additional instructions for the day of surgery: If you choose, you may shower the morning of surgery with an antibacterial soap.  DO NOT APPLY any lotions, deodorants, cologne, or perfumes.   Do not wear jewelry or makeup Do not wear nail polish, gel polish, artificial nails, or any other type of covering on natural nails (fingers and toes) Do not bring valuables to the hospital. [facility] is not responsible for valuables/personal belongings. Put on clean/comfortable clothes.  Please brush your teeth.  Ask your nurse before applying any prescription medications to the skin.

## 2024-07-05 ENCOUNTER — Other Ambulatory Visit: Payer: Self-pay

## 2024-07-05 ENCOUNTER — Encounter (HOSPITAL_COMMUNITY): Payer: Self-pay

## 2024-07-05 ENCOUNTER — Encounter (HOSPITAL_COMMUNITY)
Admission: RE | Admit: 2024-07-05 | Discharge: 2024-07-05 | Disposition: A | Discharge status: Home / Self Care | Source: Ambulatory Visit | Attending: Obstetrics and Gynecology | Admitting: Obstetrics and Gynecology

## 2024-07-05 DIAGNOSIS — Z01812 Encounter for preprocedural laboratory examination: Secondary | ICD-10-CM | POA: Insufficient documentation

## 2024-07-05 DIAGNOSIS — Z01818 Encounter for other preprocedural examination: Secondary | ICD-10-CM

## 2024-07-05 LAB — CBC
HCT: 36.3 % (ref 36.0–46.0)
Hemoglobin: 11.4 g/dL — ABNORMAL LOW (ref 12.0–15.0)
MCH: 28.2 pg (ref 26.0–34.0)
MCHC: 31.4 g/dL (ref 30.0–36.0)
MCV: 89.9 fL (ref 80.0–100.0)
Platelets: 269 K/uL (ref 150–400)
RBC: 4.04 MIL/uL (ref 3.87–5.11)
RDW: 12 % (ref 11.5–15.5)
WBC: 6.4 K/uL (ref 4.0–10.5)
nRBC: 0 % (ref 0.0–0.2)

## 2024-07-05 LAB — TYPE AND SCREEN
ABO/RH(D): A POS
Antibody Screen: NEGATIVE

## 2024-07-05 NOTE — Progress Notes (Signed)
 PCP - [facility] Cardiologist - denies  PPM/ICD - denies Device Orders -  Rep Notified -   Chest x-ray - na EKG - na Stress Test - denies ECHO - denies Cardiac Cath - denies  Sleep Study - denies CPAP -   Fasting Blood Sugar - na Checks Blood Sugar _____ times a day  Last dose of GLP1 agonist-  na GLP1 instructions:   Blood Thinner Instructions:na Aspirin Instructions:na  ERAS Protcol - clear liquids until 1130 PRE-SURGERY Ensure or G2- no  COVID TEST- na   Anesthesia review: yes-[facility]. She had a fever of 101.4. She was diagnosed with a fever Herpes Simplex and prescribed Valcyclovir which she told me she is taking. On 12/26 she was seen in the ER at [facility]. A UA and culture were performed which showed large amount of leukocytes,streptoccoccus pyogenes and she had a reactive RPR. Patient was prescribed a 10 course of Doxycycline . Provider was notified via [employer] and also called and spoke with Benita Gains who will relay the information to Dr. Abigail.   Patient denies shortness of breath, fever, cough and chest pain at PAT appointment   All instructions explained to the patient, with a verbal understanding of the material. Patient agrees to go over the instructions while at home for a better understanding.  The opportunity to ask questions was provided.

## 2024-07-06 ENCOUNTER — Ambulatory Visit (HOSPITAL_COMMUNITY): Payer: Self-pay

## 2024-07-11 DIAGNOSIS — Z01818 Encounter for other preprocedural examination: Secondary | ICD-10-CM

## 2024-07-12 NOTE — Progress Notes (Deleted)
 NEW REFERRAL TO CPP CLINIC    HPI: Sherri Schmidt is a 34 y.o. female who presents to the [facility] pharmacy clinic to discuss and initiate PrEP.  Referring ID Provider: Dr. 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  Modified Medications   No medications on file  Discontinued Medications   No medications on file        No data to display          Labs:  SCr: Lab Results  Component Value Date   CREATININE 0.86 08/11/2021   HIV Lab Results  Component Value Date   HIV Non Reactive 07/01/2024   HIV Non Reactive 01/21/2024   HIV Non Reactive 05/21/2021   HIV Non Reactive 03/05/2021   HIV Non Reactive 05/12/2019   Hepatitis B Lab Results  Component Value Date   HEPBSAG Negative 01/21/2024   Hepatitis C No results found for: HEPCAB, HCVRNAPCRQN Hepatitis A No results found for: HAV RPR and STI Lab Results  Component Value Date   LABRPR Reactive (A) 07/01/2024   LABRPR Non Reactive 01/21/2024   LABRPR NON REACTIVE 08/11/2021   LABRPR Non Reactive 05/21/2021   LABRPR Non Reactive 03/05/2021    STI Results GC CT  07/01/2024  8:12 AM Negative  Negative   01/21/2024 10:44 AM Negative  Negative   07/24/2021  3:17 PM Negative  Negative   03/05/2021  3:58 PM Negative  Negative   01/12/2021  3:29 PM Negative  Negative   07/07/2019 11:07 AM Negative  Negative   01/05/2019 12:00 AM Negative  Negative     Assessment: ***  Labs:  Last HIV ab was negative on 07/01/24  Eligible vaccinations:  ***  Number of partners in last 3 months: *** Partner preference: *** Sexual practices:  ***  Use of prevention/protection: ***  STD history: *** History of IVDU? *** History of PEP? ***  Plan: ***  Chasidy Janak L. Orrie Schubert, PharmD, BCIDP, AAHIVP, CPP Clinical Pharmacist Practitioner - Infectious Diseases Clinical Pharmacist Lead - Specialty Pharmacy [facility] for Infectious Disease

## 2024-07-13 ENCOUNTER — Ambulatory Visit: Payer: Self-pay | Admitting: Pharmacist

## 2024-07-18 NOTE — Progress Notes (Unsigned)
" ° °  HPI: Sherri Schmidt is a 34 y.o. female who presents to the [facility] pharmacy clinic to discuss and initiate PrEP.  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  Modified Medications   No medications on file  Discontinued Medications   No medications on file        No data to display          Labs:  SCr: Lab Results  Component Value Date   CREATININE 0.86 08/11/2021   HIV Lab Results  Component Value Date   HIV Non Reactive 07/01/2024   HIV Non Reactive 01/21/2024   HIV Non Reactive 05/21/2021   HIV Non Reactive 03/05/2021   HIV Non Reactive 05/12/2019   Hepatitis B Lab Results  Component Value Date   HEPBSAG Negative 01/21/2024   Hepatitis C No results found for: HEPCAB, HCVRNAPCRQN Hepatitis A No results found for: HAV RPR and STI Lab Results  Component Value Date   LABRPR Reactive (A) 07/01/2024   LABRPR Non Reactive 01/21/2024   LABRPR NON REACTIVE 08/11/2021   LABRPR Non Reactive 05/21/2021   LABRPR Non Reactive 03/05/2021    STI Results GC CT  07/01/2024  8:12 AM Negative  Negative   01/21/2024 10:44 AM Negative  Negative   07/24/2021  3:17 PM Negative  Negative   03/05/2021  3:58 PM Negative  Negative   01/12/2021  3:29 PM Negative  Negative   07/07/2019 11:07 AM Negative  Negative   01/05/2019 12:00 AM Negative  Negative     Assessment: Sherri Schmidt is a 34 YO female presenting to [facility] for initiation of HIV PrEP. Recent labs from 07/01/24 ED visit include negative pregnnacy test, reactive RPR with titer 1:1, positive T. pallidum antibodies,  negative tests for gonorrhea/chlamydia, and negative HIV test. Sherri Schmidt received treatment with ceftriaxone  500mg  and prescriptions for nitrofurantoin  and doxycycline  x 7d that visit.   Labs:  ***  Eligible vaccinations:  No vaccine records displaying - inquire about flu, COVID, Hep B, Tdap ***  Number of partners in last 3 months: *** Partner preference: *** Sexual practices: ***  Use of prevention/protection: ***  STD history: *** History of IVDU? *** History of PEP? ***  Plan: ***  Sherri Schmidt, PharmD PGY1 Pharmacy Resident [facility] 07/18/2024 4:24 PM "

## 2024-07-19 ENCOUNTER — Ambulatory Visit: Payer: Self-pay | Admitting: Pharmacist

## 2024-07-19 DIAGNOSIS — Z2981 Encounter for HIV pre-exposure prophylaxis: Secondary | ICD-10-CM

## 2024-07-28 NOTE — Progress Notes (Signed)
 Attempted pre-op call.  Left message to call back.

## 2024-07-29 ENCOUNTER — Telehealth: Payer: Self-pay

## 2024-07-29 NOTE — Telephone Encounter (Signed)
 Pre-Op notified me that patient would like to cancel her 08/02/24 surgery. I called patient to let her know Dr. Abigail would be available on 08/30/24 at 11 am or 09/06/24 at 1 pm. I left a detailed voicemail and requested a call back.

## 2024-07-29 NOTE — Telephone Encounter (Signed)
 Received message from pre-op testing that pt was contacted and she stated she would not be able to have surgery as scheduled on 1/27. Called pt and confirmed she would like to cancel surgery at this time. Advised surgery scheduler will contact her to discuss rescheduling. Pt verbalized understanding.

## 2024-07-29 NOTE — Progress Notes (Signed)
 Talked with Patient concerning up coming surgery on 08/02/24. States she can do it on that day. Instructed to call her Dr's office to let them know. I also reached out the the office to let them know to contact patient to reschedule if needed

## 2024-08-02 ENCOUNTER — Encounter (HOSPITAL_COMMUNITY): Admission: RE | Payer: Self-pay | Source: Home / Self Care

## 2024-08-02 ENCOUNTER — Ambulatory Visit (HOSPITAL_COMMUNITY): Admission: RE | Admit: 2024-08-02 | Source: Home / Self Care | Admitting: Obstetrics and Gynecology

## 2024-08-02 DIAGNOSIS — Z01818 Encounter for other preprocedural examination: Secondary | ICD-10-CM
